# Patient Record
Sex: Male | Born: 1935
Health system: Southern US, Community
[De-identification: ages and names within clinical notes are randomized; demographics above are authoritative.]

## PROBLEM LIST (undated history)

## (undated) DIAGNOSIS — I10 Essential (primary) hypertension: Secondary | ICD-10-CM

## (undated) DIAGNOSIS — B0229 Other postherpetic nervous system involvement: Secondary | ICD-10-CM

## (undated) DIAGNOSIS — E782 Mixed hyperlipidemia: Secondary | ICD-10-CM

## (undated) DIAGNOSIS — E7800 Pure hypercholesterolemia, unspecified: Secondary | ICD-10-CM

## (undated) DIAGNOSIS — K219 Gastro-esophageal reflux disease without esophagitis: Secondary | ICD-10-CM

## (undated) DIAGNOSIS — I2510 Atherosclerotic heart disease of native coronary artery without angina pectoris: Secondary | ICD-10-CM

## (undated) HISTORY — DX: Gastro-esophageal reflux disease without esophagitis: K21.9

## (undated) HISTORY — DX: Essential (primary) hypertension: I10

## (undated) HISTORY — DX: Other postherpetic nervous system involvement: B02.29

## (undated) HISTORY — DX: Mixed hyperlipidemia: E78.2

## (undated) HISTORY — DX: Pure hypercholesterolemia, unspecified: E78.00

## (undated) HISTORY — DX: Atherosclerotic heart disease of native coronary artery without angina pectoris: I25.10

---

## 2001-03-17 ENCOUNTER — Encounter: Payer: Self-pay | Admitting: Cardiology

## 2007-11-07 ENCOUNTER — Ambulatory Visit: Payer: Self-pay | Admitting: Cardiology

## 2007-11-07 ENCOUNTER — Encounter: Payer: Self-pay | Admitting: Cardiology

## 2007-11-10 ENCOUNTER — Ambulatory Visit: Payer: Self-pay | Admitting: Cardiology

## 2007-11-15 ENCOUNTER — Ambulatory Visit: Payer: Self-pay | Admitting: Cardiology

## 2007-11-15 ENCOUNTER — Inpatient Hospital Stay (HOSPITAL_BASED_OUTPATIENT_CLINIC_OR_DEPARTMENT_OTHER): Admission: RE | Admit: 2007-11-15 | Discharge: 2007-11-15 | Payer: Self-pay | Admitting: Cardiology

## 2007-11-21 ENCOUNTER — Ambulatory Visit: Payer: Self-pay | Admitting: Cardiology

## 2007-11-21 ENCOUNTER — Ambulatory Visit (HOSPITAL_COMMUNITY): Admission: RE | Admit: 2007-11-21 | Discharge: 2007-11-22 | Payer: Self-pay | Admitting: Cardiology

## 2007-11-22 ENCOUNTER — Encounter: Payer: Self-pay | Admitting: Cardiology

## 2007-11-22 DIAGNOSIS — I2510 Atherosclerotic heart disease of native coronary artery without angina pectoris: Secondary | ICD-10-CM

## 2007-11-22 HISTORY — DX: Atherosclerotic heart disease of native coronary artery without angina pectoris: I25.10

## 2007-12-06 ENCOUNTER — Ambulatory Visit: Payer: Self-pay | Admitting: Cardiology

## 2007-12-07 ENCOUNTER — Encounter: Payer: Self-pay | Admitting: Cardiology

## 2008-02-28 ENCOUNTER — Encounter: Payer: Self-pay | Admitting: Cardiology

## 2008-05-10 ENCOUNTER — Ambulatory Visit: Payer: Self-pay | Admitting: Cardiology

## 2009-05-03 ENCOUNTER — Ambulatory Visit: Payer: Self-pay | Admitting: Cardiology

## 2009-05-03 DIAGNOSIS — R0602 Shortness of breath: Secondary | ICD-10-CM

## 2009-05-03 DIAGNOSIS — R079 Chest pain, unspecified: Secondary | ICD-10-CM

## 2009-05-03 DIAGNOSIS — I10 Essential (primary) hypertension: Secondary | ICD-10-CM | POA: Insufficient documentation

## 2009-05-03 DIAGNOSIS — I209 Angina pectoris, unspecified: Secondary | ICD-10-CM

## 2009-05-03 DIAGNOSIS — I2510 Atherosclerotic heart disease of native coronary artery without angina pectoris: Secondary | ICD-10-CM | POA: Insufficient documentation

## 2009-05-03 DIAGNOSIS — E7800 Pure hypercholesterolemia, unspecified: Secondary | ICD-10-CM | POA: Insufficient documentation

## 2009-05-06 ENCOUNTER — Encounter: Payer: Self-pay | Admitting: Cardiology

## 2009-05-08 ENCOUNTER — Encounter (INDEPENDENT_AMBULATORY_CARE_PROVIDER_SITE_OTHER): Payer: Self-pay | Admitting: *Deleted

## 2009-09-16 ENCOUNTER — Encounter: Payer: Self-pay | Admitting: Cardiology

## 2009-09-23 ENCOUNTER — Encounter: Payer: Self-pay | Admitting: Cardiology

## 2009-10-23 ENCOUNTER — Encounter: Payer: Self-pay | Admitting: Cardiology

## 2010-03-18 NOTE — Letter (Signed)
Summary: Engineer, materials at [facility]  [address]   [place], [place] [address]   Phone: [phone]  Fax: [phone]        May 08, 2009 MRN: [id number]   [facility] [address] [place], [place]  [address]   Dear Mr. EOFF,  Your test ordered by Selena Batten has been reviewed by your physician (or physician assistant) and was found to be normal or stable. Your physician (or physician assistant) felt no changes were needed at this time.  ____ Echocardiogram  ____ Cardiac Stress Test  __X__ Lab Work  ____ Peripheral vascular study of arms, legs or neck  ____ CT scan or X-ray  ____ Lung or Breathing test  ____ Other:   Thank you.   Hoover Brunette, LPN    Duane Boston, M.D., F.A.C.C. Thressa Sheller, M.D., F.A.C.C. Oneal Grout, M.D., F.A.C.C. Cheree Ditto, M.D., F.A.C.C. Daiva Nakayama, M.D., F.A.C.C. Kenney Houseman, M.D., F.A.C.C. Jeanne Ivan, PA-C

## 2010-03-18 NOTE — Miscellaneous (Signed)
Summary: Orders Update - FLP,LFT  Clinical Lists Changes  Orders: Added new Test order of T-Lipid Profile ([id number]) - Signed Added new Test order of T-Hepatic Function ([id number]) - Signed 

## 2010-03-18 NOTE — Medication Information (Signed)
Summary: RX Folder/ FAXED [facility] CHANGE  RX Folder/ FAXED [facility] CHANGE   Imported By: Dorise Hiss 09/17/2009 10:25:44  _____________________________________________________________________  External Attachment:    Type:   Image     Comment:   External Document

## 2010-03-18 NOTE — Assessment & Plan Note (Signed)
Summary: 1 YR FU PER MARCH REMINDER-SRS   Visit Type:  Follow-up Primary Provider:  Linna Darner  CC:  follow-up visit.  History of Present Illness: the patient is a 77 year old male with a history of a chronically occluded LAD status post revascularization with a Promus drug-eluting stent.the patient has reported no recurrence of substernal chest pain.the patient denies any shortness of breath orthopnea PND.he has a history of hypertension was well-controlled. He continues to complain of a chronic cough. He did have an upper airway cough syndrome. From cardiac standpoint is otherwise stable.  Clinical Review Panels:  CXR CXR results  Clinical Data: Pre-procedure evaluation for cardiac         catheterization.                   CHEST - 2 VIEW                             Comparison: 11/13/2002                   Findings: No focal consolidation, edema, or pleural effusion.         Cardiopericardial silhouette is borderline enlarged and likely         accentuated by the low lung volumes. Imaged bony structures of the         thorax are intact.                   IMPRESSION:         Stable exam.  No acute cardiopulmonary findings.                                           Reported By: ERIC Lora Paula, MD      (11/10/2007)  Cardiac Imaging Cardiac Cath Findings  CARDIAC CATHETERIZATION   RESULTS:  Initially, the left anterior descending artery was totally   occluded at its proximal portion.  Following stenting, the stenosis   improved from 100% to 0% and the flow improved from TIMI 0 to TIMI 3   flow.      CONCLUSION:  Successful PCI of a chronically totally occluded left   anterior descending artery using a PROMUS drug-eluting stent with   improvement of central narrowing from 100% to 0% and improvement of the   flow from TIMI 0 to TIMI 3 flow.      DISPOSITION:  The patient returned to post angio room for further   observation.  He is to remain on long-term Plavix and aspirin.                 [name], MD, [facility]  (11/21/2007)    Preventive Screening-Counseling & Management  Alcohol-Tobacco     Smoking Status: quit     Year Quit: 1980  Current Medications (verified): 1)  Amlodipine Besylate 10 Mg Tabs (Amlodipine Besylate) .... Take 1 Tablet By Mouth Once A Day 2)  Atenolol 50 Mg Tabs (Atenolol) .... Take 1 Tablet By Mouth Once A Day 3)  Chlorthalidone 25 Mg Tabs (Chlorthalidone) .... Take 1 Tablet By Mouth Once A Day 4)  Imdur 60 Mg Xr24h-Tab (Isosorbide Mononitrate) .... Take 1 Tablet By Mouth Once A Day 5)  Plavix 75 Mg Tabs (Clopidogrel Bisulfate) .... Take 1 Tablet By Mouth Once A Day 6)  Simvastatin 40  Mg Tabs (Simvastatin) .... Take 1 Tablet By Mouth Once A Day 7)  Nitrostat 0.4 Mg Subl (Nitroglycerin) .Marland Kitchen.. 1 Tablet Under Tongue At Onset of Chest Pain; You May Repeat Every 5 Minutes For Up To 3 Doses. 8)  Terazosin Hcl 5 Mg Caps (Terazosin Hcl) .... Take 1 Tablet By Mouth Once A Day 9)  Aspir-Trin 325 Mg Tbec (Aspirin) .... Take 1 Tablet By Mouth Once A Day 10)  Vitamin D 1000 Unit Tabs (Cholecalciferol) .... Take 2 Tablet By Mouth Once A Day 11)  Glucosamine-Chondroitin 1500-1200 Mg/35ml Liqd (Glucosamine-Chondroitin) .... Take 1 Tablet By Mouth Once A Day 12)  Aleve 220 Mg Tabs (Naproxen Sodium) .... Take 1 Tablet By Mouth Twice A Day  Allergies (verified): No Known Drug Allergies  Comments:  Nurse/Medical Assistant: The patient's medications and allergies were reviewed with the patient and were updated in the Medication and Allergy Lists. List reviewed.  Past History:  Family History: Last updated: 05/03/2009 Brother had Coronary artery bypass grafing Sister had MI  Risk Factors: Smoking Status: quit (05/03/2009)  Past Medical History: s/p LAD stent 10/09 Normal LV systolic function. Hypertension History of upper airway cough syndrome  Social History: Smoking Status:  quit  Review of Systems  The patient denies fatigue,  malaise, fever, weight gain/loss, vision loss, decreased hearing, hoarseness, chest pain, palpitations, shortness of breath, prolonged cough, wheezing, sleep apnea, coughing up blood, abdominal pain, blood in stool, nausea, vomiting, diarrhea, heartburn, incontinence, blood in urine, muscle weakness, joint pain, leg swelling, rash, skin lesions, headache, fainting, dizziness, depression, anxiety, enlarged lymph nodes, easy bruising or bleeding, and environmental allergies.    Vital Signs:  Patient profile:   77 year old male Height:      70 inches Weight:      202 pounds BMI:     29.09 O2 Sat:      96 % Pulse rate:   76 / minute BP sitting:   148 / 82  (left arm) Cuff size:   large  Vitals Entered By: Carlye Grippe (May 03, 2009 10:08 AM)  Nutrition Counseling: Patient's BMI is greater than 25 and therefore counseled on weight management options. CC: follow-up visit   Physical Exam  Additional Exam:  General: Well-developed, well-nourished in no distress head: Normocephalic and atraumatic eyes PERRLA/EOMI intact, conjunctiva and lids normal nose: No deformity or lesions mouth normal dentition, normal posterior pharynx neck: Supple, no JVD.  No masses, thyromegaly or abnormal cervical nodes lungs: Normal breath sounds bilaterally without wheezing.  Normal percussion heart: regular rate and rhythm with normal S1 and S2, no S3 or S4.  PMI is normal.  No pathological murmurs abdomen: Normal bowel sounds, abdomen is soft and nontender without masses, organomegaly or hernias noted.  No hepatosplenomegaly musculoskeletal: Back normal, normal gait muscle strength and tone normal pulsus: Pulse is normal in all 4 extremities Extremities: No peripheral pitting edema neurologic: Alert and oriented x 3 skin: Intact without lesions or rashes cervical nodes: No significant adenopathy psychologic: Normal affect    EKG  Procedure date:  05/03/2009  Findings:      normal sinus rhythm.  Normal EKG heart rate 69 beats per minute  Impression & Recommendations:  Problem # 1:  CORONARY ATHEROSCLEROSIS NATIVE CORONARY ARTERY (ICD-414.01) the patient is status post stent placement. He denies any recurrent chest pain. He stable. His updated medication list for this problem includes:    Amlodipine Besylate 10 Mg Tabs (Amlodipine besylate) .Marland Kitchen... Take 1 tablet by mouth once a  day    Atenolol 50 Mg Tabs (Atenolol) .Marland Kitchen... Take 1 tablet by mouth once a day    Imdur 60 Mg Xr24h-tab (Isosorbide mononitrate) .Marland Kitchen... Take 1 tablet by mouth once a day    Plavix 75 Mg Tabs (Clopidogrel bisulfate) .Marland Kitchen... Take 1 tablet by mouth once a day    Nitrostat 0.4 Mg Subl (Nitroglycerin) .Marland Kitchen... 1 tablet under tongue at onset of chest pain; you may repeat every 5 minutes for up to 3 doses.    Aspir-trin 325 Mg Tbec (Aspirin) .Marland Kitchen... Take 1 tablet by mouth once a day  Orders: EKG w/ Interpretation (93000) T-Lipid Profile ([id number]) T-Hepatic Function ([id number])  Problem # 2:  HYPERTENSION (ICD-401.9) blood pressure is well controlled. Continue current medical regimen. His updated medication list for this problem includes:    Amlodipine Besylate 10 Mg Tabs (Amlodipine besylate) .Marland Kitchen... Take 1 tablet by mouth once a day    Atenolol 50 Mg Tabs (Atenolol) .Marland Kitchen... Take 1 tablet by mouth once a day    Chlorthalidone 25 Mg Tabs (Chlorthalidone) .Marland Kitchen... Take 1 tablet by mouth once a day    Terazosin Hcl 5 Mg Caps (Terazosin hcl) .Marland Kitchen... Take 1 tablet by mouth once a day    Aspir-trin 325 Mg Tbec (Aspirin) .Marland Kitchen... Take 1 tablet by mouth once a day  Problem # 3:  PURE HYPERCHOLESTEROLEMIA (ICD-272.0) we will check a lipid panel and liver function tests. His updated medication list for this problem includes:    Simvastatin 40 Mg Tabs (Simvastatin) .Marland Kitchen... Take 1 tablet by mouth once a day  Orders: T-Lipid Profile ([id number]) T-Hepatic Function [phone])  Patient Instructions: 1)  Labs:  FLP / LFT 2)  Follow  up in  1 year

## 2010-05-19 ENCOUNTER — Encounter: Payer: Self-pay | Admitting: Cardiology

## 2010-05-19 ENCOUNTER — Ambulatory Visit (INDEPENDENT_AMBULATORY_CARE_PROVIDER_SITE_OTHER): Payer: Federal, State, Local not specified - PPO | Admitting: Cardiology

## 2010-05-19 DIAGNOSIS — E7800 Pure hypercholesterolemia, unspecified: Secondary | ICD-10-CM

## 2010-05-19 DIAGNOSIS — I2510 Atherosclerotic heart disease of native coronary artery without angina pectoris: Secondary | ICD-10-CM

## 2010-05-19 DIAGNOSIS — I10 Essential (primary) hypertension: Secondary | ICD-10-CM

## 2010-05-19 DIAGNOSIS — Z9861 Coronary angioplasty status: Secondary | ICD-10-CM

## 2010-05-19 MED ORDER — PRAVASTATIN SODIUM 40 MG PO TABS: 40.0000 mg | ORAL_TABLET | Freq: Every evening | ORAL | Status: DC

## 2010-05-19 NOTE — Progress Notes (Signed)
HPI The patient is a 77 year old male with history of chronically occluded LAD status post revascularization with drug-eluting stents. Procedure was performed in October of 2009. He has normal LV function. The patient has reportedly no recurrence of substernal chest pain. Her last office visit he was doing quite well. He did appear that he had an upper airway cough syndrome. The patient denies any chest pain. He has been doing well. He reports no shortness of breath orthopnea or PND. He has no claudications or syncope. The patient is requesting if he can discontinue Plavix. He has been out of the ears on Plavix after drug-eluting stent placement. I also pointed out the interaction of Norvasc and Zocor. His blood pressure is poorly controlled but he seemed a bit of pain after he visited the dentist this morning. At this point the patient states that he will recheck his blood pressure first. He will follow up with his primary care physician  No Known Allergies  No current outpatient prescriptions on file prior to visit.    Past Medical History  Diagnosis Date  . Pure hypercholesterolemia   . Essential hypertension, benign   . Coronary artery disease 11/22/2007    cardiac catheterization, percutaneous coronary intervention of the left   . GERD (gastroesophageal reflux disease)     No past surgical history on file.  Family History  Problem Relation Age of Onset  . Heart attack Sister   . Coronary artery disease Brother     CABG    History   Social History  . Marital Status: Married    Spouse Name: N/A    Number of Children: N/A  . Years of Education: N/A   Occupational History  . retired     work in Press photographer for 30 years   Social History Main Topics  . Smoking status: Former Smoker    Quit date: 02/16/1978  . Smokeless tobacco: Not on file  . Alcohol Use: Not on file  . Drug Use: Not on file  . Sexually Active: Not on file   Other Topics Concern  . Not on file   Social  History Narrative  . No narrative on file    review of systems:Pertinent positives as outlined above. The remainder of the 18  point review of systems is negative   PHYSICAL EXAM BP 179/71  Pulse 81  Ht 5\' 10"  (1.778 m)  Wt 202 lb (91.627 kg)  BMI 28.98 kg/m2  General: Well-developed, well-nourished in no distress Head: Normocephalic and atraumatic Eyes:PERRLA/EOMI intact, conjunctiva and lids normal Ears: No deformity or lesions Mouth:normal dentition, normal posterior pharynx Neck: Supple, no JVD.  No masses, thyromegaly or abnormal cervical nodes Lungs: Normal breath sounds bilaterally without wheezing.  Normal percussion Cardiac: regular rate and rhythm with normal S1 and S2, no S3 or S4.  PMI is normal.  No pathological murmurs Abdomen: Normal bowel sounds, abdomen is soft and nontender without masses, organomegaly or hernias noted.  No hepatosplenomegaly MSK: Back normal, normal gait muscle strength and tone normal Vascular: Pulse is normal in all 4 extremities Extremities: No peripheral pitting edema Neurologic: Alert and oriented x 3 Skin: Intact without lesions or rashes Lymphatics: No significant adenopathy Psychologic: Normal affect  WJX:BJYNWG sinus rhythm with no acute changes. Otherwise normal tracing heart rate 68 beats per minute   ASSESSMENT AND PLAN

## 2010-05-19 NOTE — Patient Instructions (Signed)
1.  Stop Zocor  2.  Change to Pravachol 40mg  every evening  3.  Stop Plavix 4.  Your physician wants you to follow up in:  1 year.  You will receive a reminder letter in the mail one-two months in advance.  If you don't receive a letter, please call our office to schedule the follow up appointment

## 2010-05-19 NOTE — Assessment & Plan Note (Signed)
The patient is now more than 2 years out from stent placement and can stop Plavix. We will continue aspirin.

## 2010-05-19 NOTE — Assessment & Plan Note (Signed)
Patient's patient's blood pressure is poorly controlled but he just returned from the dentist and has quite still a bit of pain. Have asked him to monitor his blood pressure and we will see the needs additional therapy.

## 2010-05-19 NOTE — Assessment & Plan Note (Signed)
There is a drug interaction between simvastatin and Norvasc at a high dose. We will switch the patient to Pravachol. This will be at equivalent dosing.

## 2010-07-01 NOTE — Assessment & Plan Note (Signed)
[facility]                          [facility] OFFICE NOTE   Frank, Hueston AKEEM GETTER Martinez             MRN:          [id number]  DATE:11/10/2007                            DOB:          [birth year]    REFERRING PHYSICIAN:  Erasmo Downer, MD   REFERRING PHYSICIAN:  Erasmo Downer, MD.   REASON FOR CONSULTATION:  Evaluation of the patient with abnormal  Cardiolite stress study.   HISTORY OF PRESENT ILLNESS:  The patient is a very pleasant 77 year old  male with no prior history of coronary artery disease.  The patient  recently saw Dr. Linna Darner in the office and reported substernal chest pain  during exertion.  He reported a chest pain across the entire precordium,  always set on by exertion.  This is then followed by difficulty  breathing and as well as a dry cough.  He also noticed some weight gain  over the last several months and attributes his dyspnea to his weight  gain.  He reports to me that tightness across the chest has been going  on for several months.  Also, when he lifts something heavy, he has  difficulty with breathing and he has to cough frequently.  The patient  was set up for Cardiolite study, which was interpreted by Dr. Olga Millers.  Dr. Jens Som felt that there was probably abnormal LV  perfusion, that there was small prior apically infarct, and mild-to-  moderate ischemia in the mid-to-distal anterior wall and apex.  The  ejection fraction was 56%.   ALLERGIES:  No known drug allergies.   MEDICATIONS:  1. Aspirin 81 mg p.o. daily.  2. Terazosin 5 mg p.o. daily.  3. Amlodipine 10 mg p.o. daily.  4. Chlorthalidone 25 mg p.o. daily.  5. Atenolol 50 mg p.o. daily.   SOCIAL HISTORY:  The patient used to work in Press photographer for 30 years, then  used to work for [employer] for [employer].  He is now  retired.  He does used to smoke tobacco, but quit 23 years ago.   FAMILY HISTORY:  Notable for brother who had  coronary artery bypass  grafting at age 63 and a sister who had a myocardial infarction.   PAST MEDICAL HISTORY:  1. Hypertension.  2. Possible dyslipidemia.   REVIEW OF SYSTEMS:  As per HPI.  The patient denies any nausea or  vomiting.  No fever or chills.  No melena or hematochezia.  No dysuria  or frequency.  No palpitations or syncope.  No visual changes.  Remainder of review of systems, otherwise, negative.   PHYSICAL EXAMINATION:  VITAL SIGNS:  Blood pressure 157/85, heart rate  is 67, and weight is 205 pounds.  NECK:  Normal carotid upstroke.  No carotid bruits.  LUNGS:  Clear breath sounds bilaterally.  HEART:  Regular rate and rhythm.  Normal S1 and S2.  No murmurs, rubs,  or gallops.  ABDOMEN:  Soft and nontender.  No rebound or guarding.  Good bowel  sounds.  EXTREMITIES:  No cyanosis, clubbing, or edema.  NEURO:  The patient is alert, oriented, and grossly non focal.   PROBLEM LIST:  1. Angina.  2. Abnormal Cardiolite stress study.  3. Normal ejection fraction.  4. Cardiac risk factors.   PLAN:  1. The patient's symptoms are consistent with angina.  He has a      positive Cardiolite study and likely significant coronary artery      disease.  I have discussed the risk and benefit of cardiac      catheterization with the patient.  He is willing to proceed.  In      the interim, I have given the patient prescription of Imdur and      p.r.n. nitroglycerin.  I also advised him that if he develops      resting angina, he needs to present himself immediately to the      emergency room and take nitroglycerin.  The patient is tentatively      scheduled for next Tuesday for cardiac catheterization at the [facility].     Learta Codding, MD,FACC  Electronically Signed    GED/MedQ  DD: 11/11/2007  DT: 11/12/2007  Job #: [id number]   cc:   Erasmo Downer, MD

## 2010-07-01 NOTE — Assessment & Plan Note (Signed)
[facility]                          [facility]ICE NOTE   Frank, Libertini JACO TREAS Martinez             MRN:          [id number]  DATE:05/10/2008                            DOB:          1933/05/16    REFERRING PHYSICIAN:  Erasmo Downer, MD   HISTORY OF PRESENT ILLNESS:  The patient is a 77 year old male with  history of chronic occluded LAD which was opened by Dr. Juanda Chance and a  Promus drug-eluting stent was placed.  The patient has had dramatic  improvement.  Since that time, he has no chest pain or shortness of  breath.  He uses his Wii at home as an exercise program.  The patient's  lipid panel also demonstrates that he had been exercising quite a bit as  his HDL has come up from 30-38 over a little bit more than 3 months.  His LDL cholesterol is only 68.  He is doing remarkably well.  He has no  dyspnea, shortness of breath, orthopnea, PND, palpitations, or syncope.  His blood pressure is elevated today, but this has not been in the case  in the past and Dr. Linna Darner is following this.   MEDICATIONS:  1. Terazosin 5 mg p.o. daily.  2. Amlodipine 10 mg p.o. daily.  3. Chlorthalidone 25 mg p.o. daily.  4. Atenolol 50 mg p.o. daily.  5. Plavix 75 mg p.o. daily.  6. Aspirin 325 mg p.o. daily.  7. Simvastatin 40 mg p.o. daily.  8. Vitamin D.  9. Glucosamine.   PHYSICAL EXAMINATION:  VITAL SIGNS:  Blood pressure 151/82, heart rate  64, and weight 206 pounds.  NECK:  Normal carotid upstroke.  No carotid bruits.  No thyromegaly.  No  nodular thyroid.  JVP is 6 cm.  LUNGS:  Clear breath sounds bilaterally.  HEART:  Regular rate and rhythm.  Normal S1 and S2.  No murmur, rubs, or  gallops.  ABDOMEN:  Soft and nontender.  No rebound or guarding.  Good bowel  sounds.  EXTREMITIES:  No cyanosis, clubbing, or edema.   PROBLEM LIST:  1. Status post Promus drug-eluting stent to chronic occluded LAD      (asymptomatic).  2. Improved normal  ejection fraction.  3. Hypertension (followed by Dr. Linna Darner).  4. Dizziness, resolved.  5. Cough (likely secondary to gastroesophageal reflux disease).   PLAN:  1. The patient's blood pressure is not subsequently well controlled,      but I do think there is a component of white-coat hypertension.      Last office visit, the blood pressure was better.  I will ask Dr.      Linna Darner to further followup on this and get more blood pressure      readings and adjust his medications as needed accordingly.  2. We reviewed the patient's lipid panel and this is at goal.  This      can be repeated in the next couple of months.  3. The patient does have a cough which likely related to      gastroesophageal reflux disease and I have started the patient on      Zantac 150  mg p.o. nightly.  We will see the patient back in 1      year.      Learta Codding, MD,FACC  Electronically Signed    GED/MedQ  DD: 05/10/2008  DT: 05/10/2008  Job #: [id number]   cc:   Erasmo Downer, MD

## 2010-07-01 NOTE — Cardiovascular Report (Signed)
NAMEKENJIRO, [name]                ACCOUNT NO.:  [id number]   MEDICAL RECORD NO.:  [id number]          PATIENT TYPE:  INP   LOCATION:  6526                         FACILITY:  [facility]   PHYSICIAN:  Everardo Beals. Juanda Chance, MD, FACCDATE OF BIRTH:  04-May-1933   DATE OF PROCEDURE:  11/21/2007  DATE OF DISCHARGE:                            CARDIAC CATHETERIZATION   CLINICAL HISTORY:  [name] is 77 years old and was developed symptoms  of shortness of breath about a year ago.  This was initially thought to  be pulmonary, but he subsequently had a stress test, which was abnormal  and saw Dr. Andee Lineman and was referred for angiography, which was performed  by Dr. Antoine Poche last week.  This showed a total occlusion of the LAD,  although the area of the occlusion was quite short and there were some  bridging collaterals.  The lesion appeared reasonably favorable for  intervention and we brought him back today for an attempt at treatment  of the chronic total occlusion.   PROCEDURE:  The procedure was performed by the right femoral artery and  arterial sheath and a 6-French Q4 guiding catheter with side holes.  The  patient had been started on Plavix last week and was given an additional  300 mg load and took a noncoated aspirin this morning.  The patient was  given an Angiomax bolus and infusion.  We first went in with a PT2 Light  Support wire.  We were unable to cross the lesion with this wire.  We  then used a [employer] 4.5 wire with a sharp bend where the vessel  made a sharp turn.  We supported this with a 2.0 x 15 mm over the wire  Apex balloon.  With the help of the balloon, we were able to cross the  lesion with the [employer] wire into the distal vessel.  We then  advanced the balloon across the lesion, did a distal injection to be  sure we were in the true channel.  We then passed a Prowater wire  through the balloon.  We performed 2 dilatations up to 10 atmospheres  for 30 seconds  with the 2.0 balloon at the lesion site.  We then  exchanged for a 2.5 x 20 mm Apex balloon and performed 1 inflation up to  8 atmospheres for 30 seconds.  We then deployed a 2.75 x 25 mm PROMUS  stent.  We repositioned the stent so the distal edge was just before  bifurcation of the diagonal branch and the proximal stent was located  just proximal to a septal perforator.  We deployed this with 1 inflation  at 12 atmospheres for 30 seconds.  We postdilated with a 3.0 x 15 mm Hastings  Voyager performing 3 inflations at 18 atmospheres for 30 seconds.  Final  diagnostics were then performed through the guiding catheter.  The  patient tolerated the procedure well and left laboratory in satisfactory  condition.   RESULTS:  Initially, the left anterior descending artery was totally  occluded at its proximal portion.  Following stenting, the stenosis  improved from 100% to 0% and the flow improved from TIMI 0 to TIMI 3  flow.   CONCLUSION:  Successful PCI of a chronically totally occluded left  anterior descending artery using a PROMUS drug-eluting stent with  improvement of central narrowing from 100% to 0% and improvement of the  flow from TIMI 0 to TIMI 3 flow.   DISPOSITION:  The patient returned to post angio room for further  observation.  He is to remain on long-term Plavix and aspirin.      [name], MD, [facility]  Electronically Signed     BRB/MEDQ  D:  11/21/2007  T:  11/22/2007  Job:  [id number]   cc:   Learta Codding, MD,FACC  Rollene Rotunda, MD, [facility]  Erasmo Downer, MD

## 2010-07-01 NOTE — Discharge Summary (Signed)
[name], [name]                ACCOUNT NO.:  [id number]   MEDICAL RECORD NO.:  [id number]          PATIENT TYPE:  INP   LOCATION:  6526                         FACILITY:  [facility]   PHYSICIAN:  Everardo Beals. Juanda Chance, MD, FACCDATE OF BIRTH:  [birth year]   DATE OF ADMISSION:  11/21/2007  DATE OF DISCHARGE:  11/22/2007                               DISCHARGE SUMMARY   PRIMARY CARDIOLOGIST:  Learta Codding, MD, [facility].   PRIMARY CARE PHYSICIAN:  Erasmo Downer, MD in [place].   PROCEDURES PERFORMED DURING HOSPITALIZATION:  1. Cardiac catheterization first performed by Dr. Charlies Constable.  2. Percutaneous coronary intervention to the left anterior descending      reducing it from 100% to 0% using a Promus 2.75 mm x 23-mm drug-      eluting stent.   FINAL DISCHARGE DIAGNOSES:  1. Coronary artery disease.      a.     Status post cardiac catheterization.      b.     Status post percutaneous coronary intervention of the left       anterior descending reducing it 100% to 0%.  2. Hypertension.  3. Hyperlipidemia.   HOSPITAL COURSE:  This is a 77 year old male with no prior history of  CAD who had an abnormal Cardiolite stress test per Dr. Erasmo Downer in  [place].  The patient had complaints of chest tightness that had been going  on for several months previous to the stress test.  The patient was sent  to [facility] for cardiac catheterization in the setting.  Cardiolite stress test revealed small prior apical infarct and mild to  moderate ischemia in the mid to distal anterior wall and apex.  The  patient did undergo cardiac catheterization per Dr. Juanda Chance on November 21, 2003.  Please see Dr. Regino Schultze thorough cardiac catheterization note for  more details.  The patient recovered well without evidence of bleeding,  hematoma, or signs of infection.  The patient was placed on Plavix and  simvastatin prior to discharge.  The patient was seen and examined by  Dr. Charlies Constable on day of discharge  and found to be stable with vital  signs stable and BUN and creatinine stable.  The patient will follow up  with Dr. Andee Lineman in 2 weeks for continued cardiac management.   LABS ON DISCHARGE:  Sodium 140, potassium 3.4, chloride 101, CO2 of 30,  BUN 15, creatinine 1.09, glucose 84.  Hemoglobin 50.8, hematocrit 46.8,  white blood cells 7.3, platelets 103.  PTT 33, PT 14.1, INR 1.1.   DISCHARGE VITAL SIGNS:  Blood pressure 162/56, pulse 67, respirations  15, temperature 98.3, and O2 sat 93% on room air.   DISCHARGE MEDICATIONS:  1. Atenolol 50 mg one p.o. daily.  2. Plavix 75 mg one p.o. daily.  3. Terazosin 5 mg daily.  4. Amlodipine 10 mg daily.  5. Isosorbide mononitrate ER 60 mg daily.  6. Chlorthalidone 25 mg daily.  7. Aspirin 325 mg daily.  8. Aleve p.r.n.  9. Glucosamine chondroitin p.r.n.  10.Vitamin D p.r.n.  11.Simvastatin 40 mg one  p.o. daily.  12.Nitroglycerin 0.4 mg as needed for chest pain.   ALLERGIES:  No known drug allergies.   FOLLOWUP PLANS AND APPOINTMENT:  1. The patient will be seen by Dr. Andee Lineman on December 06, 2007, at 1      p.m. for followup cardiac catheterization and PCI evaluation.  2. The patient will follow up with primary care physician for      continued medical management.  3. The patient was given post cardiac catheterization instructions      with particular emphasis on right groin site for evidence of      bleeding, hematoma, and infection.    Time spent with the patient to include physician time 30 minutes.      Bettey Mare. Lyman Bishop, NP      Everardo Beals. Juanda Chance, MD, [facility]  Electronically Signed    KML/MEDQ  D:  11/22/2007  T:  11/22/2007  Job:  [id number]   cc:   Learta Codding, MD,FACC

## 2010-07-01 NOTE — Cardiovascular Report (Signed)
[name], [name]                ACCOUNT NO.:  [id number]   MEDICAL RECORD NO.:  [id number]          PATIENT TYPE:  OIB   LOCATION:  1961                         FACILITY:  [facility]   PHYSICIAN:  Rollene Rotunda, MD, FACCDATE OF BIRTH:  May 10, 1933   DATE OF PROCEDURE:  11/15/2007  DATE OF DISCHARGE:                            CARDIAC CATHETERIZATION   PRIMARY CARE PHYSICIAN:  Erasmo Downer, MD   CARDIOLOGIST:  Learta Codding, MD,FACC   PROCEDURE:  Left heart catheterization/coronary arteriography.   INDICATIONS:  The patient with chest pain, dyspnea on exertion (all  suggestive unstable angina), and an abnormal Cardiolite suggesting  anteroapical ischemia.   PROCEDURE NOTE:  Left heart catheterization was performed via the right  femoral artery.  The artery was cannulated using the anterior wall  puncture.  A #6-French arterial sheath was inserted via the modified  Seldinger technique.  Preformed Judkins and a pigtail catheter were  utilized.  The patient tolerated the procedure well and left the lab in  stable condition.   RESULTS:  Hemodynamics LV 155/21, AO 152/97.  Coronaries, left main was  calcified.  There was ostial and distal 25% stenosis.  The LAD had  ostial calcification with 30% stenosis.  There is proximal long 99%  stenosis that terminated and total occlusion, but there was some  reasonable filling of the LAD with collaterals demonstrating two  moderate-sized diagonals and a large LAD.  Circumflex in the AV groove  had proximal calcifications.  There were proximal luminal  irregularities.  First obtuse marginal was small, ostial 30% stenosis  and second obtuse marginal was small with ostial 50% stenosis.  Third  obtuse marginal was large and normal.  Posterolateral was moderate sized  and normal.  The right coronary artery is very large dominant vessel.  There are diffuse luminal irregularities.  The PDA was large with ostial  25% stenosis.  There is mid 50%  stenosis.  The posterolateral-1 had 25%  followed by 25% stenosis.  Posterolateral-2 was large with luminal  irregularities.  The LVEF was 65% with apical hypokinesis.   CONCLUSION:  Severe single-vessel coronary artery disease with mildly  reduced left ventricular wall motion.   PLAN:  The patient has continued to have symptoms.  There is good  viability in the anterior wall as it has some motion and collateral  flow.  Therefore, we will review the films with Dr. Juanda Chance and the plan  will be for elective revascularization of the LAD.      Rollene Rotunda, MD, [facility]  Electronically Signed    JH/MEDQ  D:  11/15/2007  T:  11/15/2007  Job:  [id number]   cc:   Erasmo Downer, MD

## 2010-07-01 NOTE — Assessment & Plan Note (Signed)
[facility]                          [facility] OFFICE NOTE   NAME:[name], [name]             MRN:          [id number]  DATE:12/06/2007                            DOB:          Nov 28, 1933    REFERRING PHYSICIAN:  Erasmo Downer, MD   HISTORY OF PRESENT ILLNESS:  The patient is a 77 year old male with no  prior history of coronary artery disease, underwent recent stress test  which was abnormal and he underwent catheterization by Dr. Juanda Chance opened  a chronically occluded LAD.  The patient had dramatic symptom  improvement after this procedure, however.  The patient received a  Promus drug-eluting stent.  He is now on Plavix.  He is doing quite  well.  He denies any chest pain, shortness of breath, orthopnea, or PND.  He has no palpitations or syncope.   MEDICATIONS:  1. Terazosin 5 mg p.o. daily.  2. Amlodipine 10 mg p.o. daily.  3. Chlorthalidone 25 mg p.o. daily.  4. Atenolol 50 mg daily.  5. Plavix 75 daily.  6. Isosorbide mononitrate 60 mg daily.  7. Aspirin 325 daily.  8. Simvastatin 40 mg p.o. daily.  9. Vitamin E.  10.Glucosamine.   PHYSICAL EXAMINATION:  VITAL SIGNS:  Blood pressure 142/70, heart rate  63, weight 224.  NECK:  Normal carotid upstroke.  No carotid bruits.  LUNGS:  Clear breath sounds bilaterally.  HEART:  Regular rate and rhythm.  Normal S1 and S2.  No murmurs, rubs,  or gallops.  ABDOMEN:  Soft, nontender.  No rebound or guarding.  Good bowel sounds.  The right groin is stable.  There is no bruit.  EXTREMITIES:  No cyanosis, clubbing, or edema.  NEUROLOGIC:  The patient is alert and oriented.  Grossly nonfocal.   PROBLEMS:  1. Status post Promus drug-eluting stent to a chronically occluded LAD      favorable for percutaneous coronary intervention.  2. Resolution of symptoms.  3. Normal ejection fraction.  4. Hypertension.  5. Dizziness.   PLAN:  1. The patient complains of some slight dizziness  likely secondary to      Imdur due to preload reduction we cut that to  30 mg p.o. daily.  2. The patient will follow up in 3 months with [place].  We will do some      cholesterol testing at that      time as well as lipid panel.  3. Dr. Linna Darner has been notified about the patient's condition.     Learta Codding, MD,FACC  Electronically Signed    GED/MedQ  DD: 12/06/2007  DT: 12/07/2007  Job #: [id number]   cc:   Erasmo Downer, MD

## 2010-10-17 DIAGNOSIS — R079 Chest pain, unspecified: Secondary | ICD-10-CM

## 2010-10-23 DIAGNOSIS — R072 Precordial pain: Secondary | ICD-10-CM

## 2010-10-24 ENCOUNTER — Encounter: Payer: Self-pay | Admitting: Cardiovascular Disease

## 2010-11-04 ENCOUNTER — Other Ambulatory Visit: Payer: Self-pay | Admitting: *Deleted

## 2010-11-04 DIAGNOSIS — I2510 Atherosclerotic heart disease of native coronary artery without angina pectoris: Secondary | ICD-10-CM

## 2010-11-04 MED ORDER — PRAVASTATIN SODIUM 40 MG PO TABS: 40.0000 mg | ORAL_TABLET | Freq: Every evening | ORAL | Status: DC

## 2010-11-05 ENCOUNTER — Encounter: Payer: Self-pay | Admitting: Cardiology

## 2010-11-05 ENCOUNTER — Ambulatory Visit (INDEPENDENT_AMBULATORY_CARE_PROVIDER_SITE_OTHER): Payer: Medicare Other | Admitting: Physician Assistant

## 2010-11-05 DIAGNOSIS — I2510 Atherosclerotic heart disease of native coronary artery without angina pectoris: Secondary | ICD-10-CM

## 2010-11-05 DIAGNOSIS — E7800 Pure hypercholesterolemia, unspecified: Secondary | ICD-10-CM

## 2010-11-05 DIAGNOSIS — I10 Essential (primary) hypertension: Secondary | ICD-10-CM

## 2010-11-05 MED ORDER — LISINOPRIL 10 MG PO TABS: 10.0000 mg | ORAL_TABLET | Freq: Every day | ORAL | Status: DC

## 2010-11-05 NOTE — Assessment & Plan Note (Signed)
Inadequately controlled. We'll start lisinopril 10 mg daily for aggressive management. Will check metabolic profile in one week. Patient to continue monitoring closely at home, and at time of scheduled visits with Dr. Olena Leatherwood.

## 2010-11-05 NOTE — Patient Instructions (Signed)
Your physician recommends that you go to the [facility] for lab work for FLP & LFT (cholesterol) - today and BMET in 7-10 days.   If the results of your test are normal or stable, you will receive a letter.  If they are abnormal, the nurse will contact you by phone. Decrease Aspirin to 81mg  daily Begin Lisinopril 10mg  daily Your physician wants you to follow up in: 6 months.  You will receive a reminder letter in the mail one-two months in advance.  If you don't receive a letter, please call our office to schedule the follow up appointment

## 2010-11-05 NOTE — Assessment & Plan Note (Signed)
Will reassess lipid status, on current dose of pravastatin. Previous LDL 57, 9/11.

## 2010-11-05 NOTE — Assessment & Plan Note (Signed)
No further workup indicated. Recent post hospital Lexus scan Cardiolite was negative for ischemia; EF 56%, following presentation with atypical CP. Will continue medical management, with low dose aspirin. Plavix recently discontinued, given that he is now nearly 3 years out from PCI/DES, 10/09.

## 2010-11-05 NOTE — Progress Notes (Signed)
HPI: Patient presents for post hospital followup.  Recently referred to [place] in consultation here at [facility], following presentation with atypical CP. In fact, the patient reported that he most likely had recurrent shingles. He ruled out for MI, and was referred for an outpatient stress test. This was performed as a pharmacologic imaging study (patient apparently unable to achieve 85% PMHR.). Diffusion imaging was negative for definite ischemia; EF 56%.  Clinically, patient continues to report no exertional angina pectoris. His symptoms have since resolved.  Patient does have poorly controlled HTN. He reports readings of 160-170 systolic at home.  No Known Allergies  Current Outpatient Prescriptions on File Prior to Visit  Medication Sig Dispense Refill  . amLODipine (NORVASC) 10 MG tablet Take 10 mg by mouth daily.        Marland Kitchen aspirin 325 MG tablet Take 325 mg by mouth daily.        Marland Kitchen atenolol (TENORMIN) 50 MG tablet Take 50 mg by mouth daily.        . chlorthalidone (HYGROTON) 25 MG tablet Take 25 mg by mouth daily.        . cholecalciferol (VITAMIN D) 1000 UNITS tablet Take 2,000 Units by mouth daily.        . Glucosamine-Chondroit-Vit C-Mn (GLUCOSAMINE CHONDR 1500 COMPLX) CAPS Take 1 capsule by mouth daily.       . isosorbide mononitrate (IMDUR) 60 MG 24 hr tablet Take 60 mg by mouth daily.        . nitroGLYCERIN (NITROSTAT) 0.4 MG SL tablet Place 0.4 mg under the tongue every 5 (five) minutes as needed.        . pravastatin (PRAVACHOL) 40 MG tablet Take 1 tablet (40 mg total) by mouth every evening.  90 tablet  3  . terazosin (HYTRIN) 5 MG capsule Take 5 mg by mouth at bedtime.          Past Medical History  Diagnosis Date  . Pure hypercholesterolemia   . Essential hypertension, benign   . Coronary artery disease 11/22/2007    cardiac catheterization, percutaneous coronary intervention of the left   . GERD (gastroesophageal reflux disease)     No past surgical history on  file.  History   Social History  . Marital Status: Married    Spouse Name: N/A    Number of Children: N/A  . Years of Education: N/A   Occupational History  . retired     work in Press photographer for 30 years   Social History Main Topics  . Smoking status: Former Smoker -- 0.5 packs/day for 25 years    Types: Cigarettes    Quit date: 02/16/1978  . Smokeless tobacco: Never Used  . Alcohol Use: Not on file  . Drug Use: Not on file  . Sexually Active: Not on file   Other Topics Concern  . Not on file   Social History Narrative  . No narrative on file    Family History  Problem Relation Age of Onset  . Heart attack Sister   . Coronary artery disease Brother     CABG    ROS: Negative for exertional chest pain, DOE, orthopnea, PND, lower extremity edema, palpitations, presyncope/syncope, claudication, reflux, hematuria, hematochezia, or melena. Remaining systems reviewed, and are negative.   PHYSICAL EXAM:  BP 185/81  Pulse 67  Ht 5\' 10"  (1.778 m)  Wt 190 lb (86.183 kg)  BMI 27.26 kg/m2  GENERAL: well-nourished, well-developed; NAD HEENT: NCAT, PERRLA, EOMI; sclera clear; no xanthelasma NECK:  palpable bilateral carotid pulses, no bruits; no JVD; no TM LUNGS: CTA bilaterally CARDIAC: RRR (S1, S2); no significant murmurs; no rubs or gallops ABDOMEN: soft, non-tender; intact BS EXTREMETIES: intact distal pulses; no significant peripheral edema SKIN: warm/dry; no obvious rash/lesions MUSCULOSKELETAL: no joint deformity NEURO: no focal deficit; NL affect   EKG:    ASSESSMENT & PLAN:

## 2010-11-13 ENCOUNTER — Telehealth: Payer: Self-pay | Admitting: *Deleted

## 2010-11-13 NOTE — Telephone Encounter (Signed)
Message copied by Eustace Moore on Thu Nov 13, 2010  3:51 PM ------      Message from: Lewayne Bunting E      Created: Thu Nov 13, 2010  1:33 PM       Lipid panel and liver function tests at goal.

## 2010-11-13 NOTE — Telephone Encounter (Signed)
Patient informed. 

## 2010-11-18 LAB — BASIC METABOLIC PANEL
CO2: 29
CO2: 30
Calcium: 9.2
Calcium: 9.8
Creatinine, Ser: 1.15
GFR calc Af Amer: >60
GFR calc Af Amer: >60
GFR calc non Af Amer: >60
GFR calc non Af Amer: >60
Glucose, Bld: 109 — ABNORMAL HIGH
Sodium: 139
Sodium: 140

## 2010-11-18 LAB — CBC
MCHC: 33.8
MCHC: 34
Platelets: 103 — ABNORMAL LOW
Platelets: 125 — ABNORMAL LOW
RBC: 5.11
RBC: 5.12
RDW: 13.5

## 2010-11-18 LAB — PROTIME-INR
INR: 1.1
Prothrombin Time: 14.2

## 2011-02-11 ENCOUNTER — Other Ambulatory Visit: Payer: Self-pay | Admitting: *Deleted

## 2011-02-11 MED ORDER — AMLODIPINE BESYLATE 10 MG PO TABS: 10.0000 mg | ORAL_TABLET | Freq: Every day | ORAL | Status: DC

## 2011-02-11 MED ORDER — ATENOLOL 50 MG PO TABS: 50.0000 mg | ORAL_TABLET | Freq: Every day | ORAL | Status: DC

## 2011-02-11 MED ORDER — CHLORTHALIDONE 25 MG PO TABS: 25.0000 mg | ORAL_TABLET | Freq: Every day | ORAL | Status: DC

## 2011-03-02 DIAGNOSIS — N400 Enlarged prostate without lower urinary tract symptoms: Secondary | ICD-10-CM | POA: Diagnosis not present

## 2011-03-02 DIAGNOSIS — N529 Male erectile dysfunction, unspecified: Secondary | ICD-10-CM | POA: Diagnosis not present

## 2011-03-02 DIAGNOSIS — I209 Angina pectoris, unspecified: Secondary | ICD-10-CM | POA: Diagnosis not present

## 2011-03-02 DIAGNOSIS — B0222 Postherpetic trigeminal neuralgia: Secondary | ICD-10-CM | POA: Diagnosis not present

## 2011-03-02 DIAGNOSIS — I10 Essential (primary) hypertension: Secondary | ICD-10-CM | POA: Diagnosis not present

## 2011-05-04 ENCOUNTER — Ambulatory Visit (INDEPENDENT_AMBULATORY_CARE_PROVIDER_SITE_OTHER): Payer: Federal, State, Local not specified - PPO | Admitting: Cardiology

## 2011-05-04 ENCOUNTER — Encounter: Payer: Self-pay | Admitting: Cardiology

## 2011-05-04 VITALS — BP 173/89 | HR 67 | Ht 70.0 in | Wt 200.0 lb

## 2011-05-04 DIAGNOSIS — I10 Essential (primary) hypertension: Secondary | ICD-10-CM | POA: Diagnosis not present

## 2011-05-04 DIAGNOSIS — B0229 Other postherpetic nervous system involvement: Secondary | ICD-10-CM | POA: Insufficient documentation

## 2011-05-04 DIAGNOSIS — I2510 Atherosclerotic heart disease of native coronary artery without angina pectoris: Secondary | ICD-10-CM

## 2011-05-04 MED ORDER — TERAZOSIN HCL 2 MG PO CAPS: 2.0000 mg | ORAL_CAPSULE | Freq: Every day | ORAL | Status: DC

## 2011-05-04 NOTE — Patient Instructions (Signed)
   Begin Hytrin 2mg  every morning (this is in addition to your evening dose) Your physician wants you to follow up in: 6 months.  You will receive a reminder letter in the mail one-two months in advance.  If you don't receive a letter, please call our office to schedule the follow up appointment

## 2011-05-04 NOTE — Progress Notes (Signed)
Frank Bottoms, MD, [facility] Board Certified in Adult Cardiovascular Medicine,Internal Medicine and Critical Care Medicine    CC: Followup patient with history of atypical chest pain with known coronary artery disease  HPI: Patient was hospitalized several months ago with atypical chest pain. In retrospect this was secondary to shingles and postherpetic neuralgia. He did undergo stress test which was within normal limits with no ischemia and ejection fraction of 56%. The patient state that overall is doing well. He denies any chest pain shortness of breath orthopnea or PND. He has no palpitations or syncope. Blood pressure is elevated in the office today .  PMH: reviewed and listed in Problem List in Electronic Records (and see below) Past Medical History  Diagnosis Date  . Pure hypercholesterolemia   . Essential hypertension, benign     Poorly controlled  . Coronary artery disease 11/22/2007    Status post PCI with drug-eluting stent 2009, September 2012 negative outpatient stress test  . GERD (gastroesophageal reflux disease)   . Postherpetic neuralgia    No past surgical history on file.  Allergies/SH/FHX : available in Electronic Records for review  No Known Allergies History   Social History  . Marital Status: Married    Spouse Name: N/A    Number of Children: N/A  . Years of Education: N/A   Occupational History  . retired     work in Press photographer for 30 years   Social History Main Topics  . Smoking status: Former Smoker -- 0.5 packs/day for 25 years    Types: Cigarettes    Quit date: 02/16/1978  . Smokeless tobacco: Never Used  . Alcohol Use: Not on file  . Drug Use: Not on file  . Sexually Active: Not on file   Other Topics Concern  . Not on file   Social History Narrative  . No narrative on file   Family History  Problem Relation Age of Onset  . Heart attack Sister   . Coronary artery disease Brother     CABG    Medications: Current Outpatient  Prescriptions  Medication Sig Dispense Refill  . amLODipine (NORVASC) 10 MG tablet Take 1 tablet (10 mg total) by mouth daily.  90 tablet  3  . aspirin 325 MG tablet Take 325 mg by mouth daily.        Marland Kitchen atenolol (TENORMIN) 50 MG tablet Take 1 tablet (50 mg total) by mouth daily.  90 tablet  3  . chlorthalidone (HYGROTON) 25 MG tablet Take 1 tablet (25 mg total) by mouth daily.  90 tablet  3  . cholecalciferol (VITAMIN D) 1000 UNITS tablet Take 2,000 Units by mouth daily.        . Glucosamine-Chondroit-Vit C-Mn (GLUCOSAMINE CHONDR 1500 COMPLX) CAPS Take 1 capsule by mouth daily.       . isosorbide mononitrate (IMDUR) 60 MG 24 hr tablet Take 60 mg by mouth daily.        . nitroGLYCERIN (NITROSTAT) 0.4 MG SL tablet Place 0.4 mg under the tongue every 5 (five) minutes as needed.        . pravastatin (PRAVACHOL) 40 MG tablet Take 1 tablet (40 mg total) by mouth every evening.  90 tablet  3  . terazosin (HYTRIN) 5 MG capsule Take 5 mg by mouth at bedtime.        Marland Kitchen terazosin (HYTRIN) 2 MG capsule Take 1 capsule (2 mg total) by mouth daily. (every morning)  30 capsule  6  ROS: No nausea or vomiting. No fever or chills.No melena or hematochezia.No bleeding.No claudication  Physical Exam: BP 173/89  Pulse 67  Ht 5\' 10"  (1.778 m)  Wt 200 lb (90.719 kg)  BMI 28.70 kg/m2 General: Well-nourished white male. In no distress Neck: Normal carotid upstroke no carotid bruits. No thyromegaly nonnodular thyroid. JVP is 6 cm Lungs: Clear breath sounds bilaterally without wheezing Cardiac: Regular rate and rhythm with normal S1-S2. No murmur rubs or gallops. Vascular: No edema. Normal distal pulses Skin: Warm and dry Physcologic: Normal affect  12lead ECG: Normal sinus rhythm no acute changes Limited bedside ECHO:N/A No images are attached to the encounter.    Patient Active Problem List  Diagnoses  . PURE HYPERCHOLESTEROLEMIA  . OTHER AND UNSPECIFIED ANGINA PECTORIS  . SHORTNESS OF BREATH  .  Essential hypertension, benign  . Coronary artery disease  . Postherpetic neuralgia    PLAN   Recent negative Cardiolite stress study. No recurrent angina. Continue medical therapy and therapeutic lifestyle changes.  Blood pressure is poorly controlled and patient is already taking Hytrin for BPH. I added a small dose of Hytrin at 2 mg in the morning for better blood pressure control. This is in addition to his amlodipine, atenolol and chlorthalidone.  The patient in followup in one year.

## 2011-06-02 DIAGNOSIS — I10 Essential (primary) hypertension: Secondary | ICD-10-CM | POA: Diagnosis not present

## 2011-09-08 DIAGNOSIS — I10 Essential (primary) hypertension: Secondary | ICD-10-CM | POA: Diagnosis not present

## 2011-09-08 DIAGNOSIS — E782 Mixed hyperlipidemia: Secondary | ICD-10-CM | POA: Diagnosis not present

## 2011-10-27 ENCOUNTER — Telehealth: Payer: Self-pay | Admitting: Cardiology

## 2011-10-27 NOTE — Telephone Encounter (Signed)
Called patient to reschedule appointment.  Will reschedule appointment with another provider.  Requested patient to call us to confirm appointment change.  Sending reminder letter to patient as well. ° °

## 2011-11-05 ENCOUNTER — Encounter: Payer: Self-pay | Admitting: Cardiovascular Disease

## 2011-11-05 ENCOUNTER — Ambulatory Visit (INDEPENDENT_AMBULATORY_CARE_PROVIDER_SITE_OTHER): Payer: Federal, State, Local not specified - PPO | Admitting: Cardiovascular Disease

## 2011-11-05 ENCOUNTER — Ambulatory Visit: Payer: Federal, State, Local not specified - PPO | Admitting: Cardiology

## 2011-11-05 VITALS — BP 138/78 | HR 62 | Ht 70.5 in | Wt 191.0 lb

## 2011-11-05 DIAGNOSIS — E7800 Pure hypercholesterolemia, unspecified: Secondary | ICD-10-CM | POA: Diagnosis not present

## 2011-11-05 DIAGNOSIS — I2510 Atherosclerotic heart disease of native coronary artery without angina pectoris: Secondary | ICD-10-CM | POA: Diagnosis not present

## 2011-11-05 DIAGNOSIS — I10 Essential (primary) hypertension: Secondary | ICD-10-CM | POA: Diagnosis not present

## 2011-11-05 NOTE — Assessment & Plan Note (Signed)
The patient is doing well overall. No symptoms suggestive of angina. Continue medical therapy.

## 2011-11-05 NOTE — Patient Instructions (Addendum)
Continue same medications.  Follow up in 6 months.  

## 2011-11-05 NOTE — Assessment & Plan Note (Signed)
He is on pravastatin. I recommend a target LDL of less than 70. This is being managed by Dr. Olena Leatherwood.

## 2011-11-05 NOTE — Assessment & Plan Note (Signed)
His blood pressure is currently well controlled. Continue current medications.

## 2011-11-05 NOTE — Progress Notes (Signed)
HPI  This is a 78 year old man who is here today for a routine followup visit. He has known history of coronary artery disease status post angioplasty and drug-eluting stent placement in 2009. No cardiac events since then. He also has hypertension and hyperlipidemia. Last year in September, he had atypical chest pain which likely was due to shingles. He had a nuclear stress test done which showed no evidence of ischemia. Overall, he is doing well. He denies any chest pain, dyspnea or palpitations.  No Known Allergies   Current Outpatient Prescriptions on File Prior to Visit  Medication Sig Dispense Refill  . amLODipine (NORVASC) 10 MG tablet Take 1 tablet (10 mg total) by mouth daily.  90 tablet  3  . aspirin 325 MG tablet Take 325 mg by mouth daily.        Marland Kitchen atenolol (TENORMIN) 50 MG tablet Take 1 tablet (50 mg total) by mouth daily.  90 tablet  3  . chlorthalidone (HYGROTON) 25 MG tablet Take 1 tablet (25 mg total) by mouth daily.  90 tablet  3  . cholecalciferol (VITAMIN D) 1000 UNITS tablet Take 2,000 Units by mouth daily.        . Glucosamine-Chondroit-Vit C-Mn (GLUCOSAMINE CHONDR 1500 COMPLX) CAPS Take 1 capsule by mouth daily.       . isosorbide mononitrate (IMDUR) 60 MG 24 hr tablet Take 60 mg by mouth daily.        . nitroGLYCERIN (NITROSTAT) 0.4 MG SL tablet Place 0.4 mg under the tongue every 5 (five) minutes as needed.        . pravastatin (PRAVACHOL) 40 MG tablet Take 1 tablet (40 mg total) by mouth every evening.  90 tablet  3  . terazosin (HYTRIN) 2 MG capsule Take 1 capsule (2 mg total) by mouth daily. (every morning)  30 capsule  6  . terazosin (HYTRIN) 5 MG capsule Take 5 mg by mouth at bedtime.        Marland Kitchen DISCONTD: lisinopril (PRINIVIL,ZESTRIL) 10 MG tablet Take 1 tablet (10 mg total) by mouth daily.  30 tablet  6     Past Medical History  Diagnosis Date  . Pure hypercholesterolemia   . Essential hypertension, benign     Poorly controlled  . Coronary artery disease  11/22/2007    Status post PCI with drug-eluting stent 2009, September 2012 negative outpatient stress test  . GERD (gastroesophageal reflux disease)   . Postherpetic neuralgia      History reviewed. No pertinent past surgical history.   Family History  Problem Relation Age of Onset  . Heart attack Sister   . Coronary artery disease Brother     CABG     History   Social History  . Marital Status: Married    Spouse Name: N/A    Number of Children: N/A  . Years of Education: N/A   Occupational History  . retired     work in Press photographer for 30 years   Social History Main Topics  . Smoking status: Former Smoker -- 0.5 packs/day for 25 years    Types: Cigarettes    Quit date: 02/16/1978  . Smokeless tobacco: Never Used  . Alcohol Use: Not on file  . Drug Use: Not on file  . Sexually Active: Not on file   Other Topics Concern  . Not on file   Social History Narrative  . No narrative on file     PHYSICAL EXAM   BP 138/78  Pulse  62  Ht 5' 10.5" (1.791 m)  Wt 191 lb (86.637 kg)  BMI 27.02 kg/m2  SpO2 96% Constitutional: He is oriented to person, place, and time. He appears well-developed and well-nourished. No distress.  HENT: No nasal discharge.  Head: Normocephalic and atraumatic.  Eyes: Pupils are equal and round. Right eye exhibits no discharge. Left eye exhibits no discharge.  Neck: Normal range of motion. Neck supple. No JVD present. No thyromegaly present.  Cardiovascular: Normal rate, regular rhythm, normal heart sounds and. Exam reveals no gallop and no friction rub. No murmur heard.  Pulmonary/Chest: Effort normal and breath sounds normal. No stridor. No respiratory distress. He has no wheezes. He has no rales. He exhibits no tenderness.  Abdominal: Soft. Bowel sounds are normal. He exhibits no distension. There is no tenderness. There is no rebound and no guarding.  Musculoskeletal: Normal range of motion. He exhibits no edema and no tenderness.    Neurological: He is alert and oriented to person, place, and time. Coordination normal.  Skin: Skin is warm and dry. No rash noted. He is not diaphoretic. No erythema. No pallor.  Psychiatric: He has a normal mood and affect. His behavior is normal. Judgment and thought content normal.        ASSESSMENT AND PLAN

## 2011-12-09 ENCOUNTER — Other Ambulatory Visit: Payer: Self-pay | Admitting: Cardiology

## 2011-12-09 DIAGNOSIS — I2510 Atherosclerotic heart disease of native coronary artery without angina pectoris: Secondary | ICD-10-CM

## 2011-12-09 MED ORDER — PRAVASTATIN SODIUM 40 MG PO TABS: 40.0000 mg | ORAL_TABLET | Freq: Every evening | ORAL | Status: DC

## 2011-12-10 DIAGNOSIS — Z23 Encounter for immunization: Secondary | ICD-10-CM | POA: Diagnosis not present

## 2011-12-21 ENCOUNTER — Other Ambulatory Visit: Payer: Self-pay | Admitting: Cardiology

## 2012-01-07 DIAGNOSIS — H52229 Regular astigmatism, unspecified eye: Secondary | ICD-10-CM | POA: Diagnosis not present

## 2012-01-07 DIAGNOSIS — H43819 Vitreous degeneration, unspecified eye: Secondary | ICD-10-CM | POA: Diagnosis not present

## 2012-01-07 DIAGNOSIS — H524 Presbyopia: Secondary | ICD-10-CM | POA: Diagnosis not present

## 2012-01-07 DIAGNOSIS — H5200 Hypermetropia, unspecified eye: Secondary | ICD-10-CM | POA: Diagnosis not present

## 2012-02-29 ENCOUNTER — Telehealth: Payer: Self-pay | Admitting: Cardiovascular Disease

## 2012-02-29 DIAGNOSIS — E299 Testicular dysfunction, unspecified: Secondary | ICD-10-CM | POA: Diagnosis not present

## 2012-02-29 MED ORDER — CHLORTHALIDONE 25 MG PO TABS: 25.0000 mg | ORAL_TABLET | Freq: Every day | ORAL | Status: DC

## 2012-02-29 MED ORDER — ATENOLOL 50 MG PO TABS: 50.0000 mg | ORAL_TABLET | Freq: Every day | ORAL | Status: DC

## 2012-02-29 MED ORDER — AMLODIPINE BESYLATE 10 MG PO TABS: 10.0000 mg | ORAL_TABLET | Freq: Every day | ORAL | Status: DC

## 2012-02-29 NOTE — Telephone Encounter (Signed)
REFILLS SEND TO [facility]  Amlodipine tab 10mg  - 1 tab daily  Atenolol tab 50 mg - 1 tab daily  Chlorthalid tab 25mg  - 1 tab daily

## 2012-03-18 DIAGNOSIS — I10 Essential (primary) hypertension: Secondary | ICD-10-CM | POA: Diagnosis not present

## 2012-06-01 ENCOUNTER — Other Ambulatory Visit: Payer: Self-pay | Admitting: Physician Assistant

## 2012-06-17 DIAGNOSIS — I10 Essential (primary) hypertension: Secondary | ICD-10-CM | POA: Diagnosis not present

## 2012-06-17 DIAGNOSIS — E782 Mixed hyperlipidemia: Secondary | ICD-10-CM | POA: Diagnosis not present

## 2012-08-09 ENCOUNTER — Other Ambulatory Visit: Payer: Self-pay | Admitting: Physician Assistant

## 2012-09-16 DIAGNOSIS — I10 Essential (primary) hypertension: Secondary | ICD-10-CM | POA: Diagnosis not present

## 2012-10-11 ENCOUNTER — Other Ambulatory Visit: Payer: Self-pay | Admitting: Cardiology

## 2012-10-11 ENCOUNTER — Other Ambulatory Visit: Payer: Self-pay | Admitting: Physician Assistant

## 2012-10-11 MED ORDER — AMLODIPINE BESYLATE 10 MG PO TABS: 10.0000 mg | ORAL_TABLET | Freq: Every day | ORAL | Status: DC

## 2012-10-11 MED ORDER — AMLODIPINE BESYLATE 10 MG PO TABS: 5.0000 mg | ORAL_TABLET | Freq: Every day | ORAL | Status: DC

## 2012-11-04 ENCOUNTER — Encounter: Payer: Self-pay | Admitting: Cardiovascular Disease

## 2012-11-04 ENCOUNTER — Ambulatory Visit (INDEPENDENT_AMBULATORY_CARE_PROVIDER_SITE_OTHER): Payer: Federal, State, Local not specified - PPO | Admitting: Cardiovascular Disease

## 2012-11-04 VITALS — BP 166/79 | HR 64 | Ht 70.5 in | Wt 199.0 lb

## 2012-11-04 DIAGNOSIS — I2510 Atherosclerotic heart disease of native coronary artery without angina pectoris: Secondary | ICD-10-CM

## 2012-11-04 NOTE — Patient Instructions (Signed)
Continue all current medications. Your physician wants you to follow up in:  1 year.  You will receive a reminder letter in the mail one-two months in advance.  If you don't receive a letter, please call our office to schedule the follow up appointment   

## 2012-11-04 NOTE — Progress Notes (Signed)
Patient ID: [name], male   DOB: 04-30-1933, 79 y.o.   MRN: [id number]   SUBJECTIVE: [name] is a 79 year old man who is here today for a routine followup visit. He has a known history of coronary artery disease status post angioplasty and drug-eluting stent placement in 2009. No cardiac events since then. He also has hypertension and hyperlipidemia. In September 2012, he had atypical chest pain which likely was due to shingles. He had a nuclear stress test done which showed no evidence of ischemia. Overall, he is doing well. He denies any chest pain, dyspnea or palpitations.  He built a garage last year, mows 4 lawns, and does a lot of work on his house. He says, "I want to live until I'm 89 or better".   No Known Allergies  Current Outpatient Prescriptions  Medication Sig Dispense Refill  . amLODipine (NORVASC) 10 MG tablet Take 1 tablet (10 mg total) by mouth daily.  30 tablet  3  . aspirin 81 MG tablet Take 81 mg by mouth daily.      Marland Kitchen atenolol (TENORMIN) 50 MG tablet TAKE 1 TABLET BY MOUTH DAILY ( CALL OFFICE TO SCHEDULE APPOINTMENT )  30 tablet  3  . chlorthalidone (HYGROTON) 25 MG tablet TAKE 1 TABLET BY MOUTH DAILY ( CALL OFFICE AND SCHEDULE APPOINTMENT )  30 tablet  3  . cholecalciferol (VITAMIN D) 1000 UNITS tablet Take 2,000 Units by mouth daily.        . Glucosamine-Chondroit-Vit C-Mn (GLUCOSAMINE CHONDR 1500 COMPLX) CAPS Take 1 capsule by mouth daily.       . isosorbide mononitrate (IMDUR) 60 MG 24 hr tablet Take 60 mg by mouth daily.        . nitroGLYCERIN (NITROSTAT) 0.4 MG SL tablet Place 0.4 mg under the tongue every 5 (five) minutes as needed.        . pravastatin (PRAVACHOL) 40 MG tablet Take 1 tablet (40 mg total) by mouth every evening.  90 tablet  3  . terazosin (HYTRIN) 2 MG capsule TAKE 1 CAPSULE BY MOUTH EVERY MORNING  30 capsule  2  . terazosin (HYTRIN) 5 MG capsule Take 5 mg by mouth at bedtime.        . [DISCONTINUED] lisinopril (PRINIVIL,ZESTRIL)  10 MG tablet Take 1 tablet (10 mg total) by mouth daily.  30 tablet  6   No current facility-administered medications for this visit.    Past Medical History  Diagnosis Date  . Pure hypercholesterolemia   . Essential hypertension, benign     Poorly controlled  . Coronary artery disease 11/22/2007    Status post PCI with drug-eluting stent 2009, September 2012 negative outpatient stress test  . GERD (gastroesophageal reflux disease)   . Postherpetic neuralgia     No past surgical history on file.  History   Social History  . Marital Status: Married    Spouse Name: N/A    Number of Children: N/A  . Years of Education: N/A   Occupational History  . retired     work in Press photographer for 30 years   Social History Main Topics  . Smoking status: Former Smoker -- 0.50 packs/day for 25 years    Types: Cigarettes    Quit date: 02/16/1978  . Smokeless tobacco: Never Used  . Alcohol Use: Not on file  . Drug Use: Not on file  . Sexual Activity: Not on file   Other Topics Concern  . Not on file   Social  History Narrative  . No narrative on file     Filed Vitals:   11/04/12 1258  BP: 166/79  Pulse: 64  Height: 5' 10.5" (1.791 m)  Weight: 199 lb (90.266 kg)    PHYSICAL EXAM General: NAD Neck: No JVD, no thyromegaly or thyroid nodule.  Lungs: Clear to auscultation bilaterally with normal respiratory effort. CV: Nondisplaced PMI.  Heart regular S1/S2, no S3/S4, no murmur.  No peripheral edema.  No carotid bruit.  Normal pedal pulses.  Abdomen: Soft, nontender, no hepatosplenomegaly, no distention.  Neurologic: Alert and oriented x 3.  Psych: Normal affect. Extremities: No clubbing or cyanosis.   ECG: reviewed and available in electronic records.      ASSESSMENT AND PLAN: 1. CAD: stable on current medical therapy. No changes to current medical regimen, which includes ASA, atenolol, Imdur, and pravastatin. 2. HTN: uncontrolled today, but he checks it at home regularly  and it is normal. I told him if that it is persistently elevated at home, I will start lisinopril 5 mg daily. Continue current doses of chlorthalidone and amlodipine. 3. Hyperlipidemia: on pravastatin. Will check to see if any recent lipids were checked by PCP.   Prentice Docker, M.D., F.A.C.C.

## 2012-12-19 DIAGNOSIS — Z6828 Body mass index (BMI) 28.0-28.9, adult: Secondary | ICD-10-CM | POA: Diagnosis not present

## 2012-12-19 DIAGNOSIS — M76899 Other specified enthesopathies of unspecified lower limb, excluding foot: Secondary | ICD-10-CM | POA: Diagnosis not present

## 2012-12-22 ENCOUNTER — Other Ambulatory Visit: Payer: Self-pay | Admitting: Cardiovascular Disease

## 2012-12-22 DIAGNOSIS — Z23 Encounter for immunization: Secondary | ICD-10-CM | POA: Diagnosis not present

## 2012-12-23 ENCOUNTER — Other Ambulatory Visit: Payer: Self-pay | Admitting: *Deleted

## 2012-12-23 ENCOUNTER — Other Ambulatory Visit: Payer: Self-pay | Admitting: Cardiovascular Disease

## 2012-12-23 MED ORDER — TERAZOSIN HCL 2 MG PO CAPS: ORAL_CAPSULE | ORAL | Status: DC

## 2012-12-23 NOTE — Telephone Encounter (Signed)
Requested Prescriptions   Signed Prescriptions Disp Refills  . terazosin (HYTRIN) 2 MG capsule 30 capsule 2    Sig: TAKE 1 CAPSULE BY MOUTH EVERY MORNING    Authorizing Provider: Prentice Docker A    Ordering User: Kendrick Fries

## 2013-01-05 ENCOUNTER — Other Ambulatory Visit: Payer: Self-pay | Admitting: Physician Assistant

## 2013-02-20 ENCOUNTER — Other Ambulatory Visit: Payer: Self-pay | Admitting: Physician Assistant

## 2013-03-24 DIAGNOSIS — I10 Essential (primary) hypertension: Secondary | ICD-10-CM | POA: Diagnosis not present

## 2013-03-24 DIAGNOSIS — Z6828 Body mass index (BMI) 28.0-28.9, adult: Secondary | ICD-10-CM | POA: Diagnosis not present

## 2013-03-28 DIAGNOSIS — L851 Acquired keratosis [keratoderma] palmaris et plantaris: Secondary | ICD-10-CM | POA: Diagnosis not present

## 2013-03-28 DIAGNOSIS — L570 Actinic keratosis: Secondary | ICD-10-CM | POA: Diagnosis not present

## 2013-03-28 DIAGNOSIS — L820 Inflamed seborrheic keratosis: Secondary | ICD-10-CM | POA: Diagnosis not present

## 2013-03-28 DIAGNOSIS — L821 Other seborrheic keratosis: Secondary | ICD-10-CM | POA: Diagnosis not present

## 2013-03-28 DIAGNOSIS — D485 Neoplasm of uncertain behavior of skin: Secondary | ICD-10-CM | POA: Diagnosis not present

## 2013-06-27 DIAGNOSIS — L570 Actinic keratosis: Secondary | ICD-10-CM | POA: Diagnosis not present

## 2013-06-27 DIAGNOSIS — Z6828 Body mass index (BMI) 28.0-28.9, adult: Secondary | ICD-10-CM | POA: Diagnosis not present

## 2013-06-27 DIAGNOSIS — I10 Essential (primary) hypertension: Secondary | ICD-10-CM | POA: Diagnosis not present

## 2013-09-28 DIAGNOSIS — Z6827 Body mass index (BMI) 27.0-27.9, adult: Secondary | ICD-10-CM | POA: Diagnosis not present

## 2013-09-28 DIAGNOSIS — I10 Essential (primary) hypertension: Secondary | ICD-10-CM | POA: Diagnosis not present

## 2013-09-28 DIAGNOSIS — M109 Gout, unspecified: Secondary | ICD-10-CM | POA: Diagnosis not present

## 2013-10-23 ENCOUNTER — Other Ambulatory Visit: Payer: Self-pay | Admitting: Cardiovascular Disease

## 2013-11-09 ENCOUNTER — Ambulatory Visit (INDEPENDENT_AMBULATORY_CARE_PROVIDER_SITE_OTHER): Payer: Federal, State, Local not specified - PPO | Admitting: Cardiovascular Disease

## 2013-11-09 ENCOUNTER — Encounter: Payer: Self-pay | Admitting: Cardiovascular Disease

## 2013-11-09 VITALS — BP 138/73 | HR 62 | Ht 70.0 in | Wt 198.0 lb

## 2013-11-09 DIAGNOSIS — E785 Hyperlipidemia, unspecified: Secondary | ICD-10-CM | POA: Diagnosis not present

## 2013-11-09 DIAGNOSIS — I10 Essential (primary) hypertension: Secondary | ICD-10-CM | POA: Diagnosis not present

## 2013-11-09 DIAGNOSIS — I2510 Atherosclerotic heart disease of native coronary artery without angina pectoris: Secondary | ICD-10-CM | POA: Diagnosis not present

## 2013-11-09 NOTE — Patient Instructions (Signed)
Continue all current medications. Your physician wants you to follow up in:  1 year.  You will receive a reminder letter in the mail one-two months in advance.  If you don't receive a letter, please call our office to schedule the follow up appointment   

## 2013-11-09 NOTE — Progress Notes (Signed)
Patient ID: [name], male   DOB: 06/27/1933, 80 y.o.   MRN: [id number]      SUBJECTIVE: [name] is a 80 year old man who is here today for a routine followup visit. He has a known history of coronary artery disease status post angioplasty and drug-eluting stent placement in 2009. He has had no cardiac events since then. He also has hypertension and hyperlipidemia. In September 2012, he had atypical chest pain which likely was due to shingles. He had a nuclear stress test done which showed no evidence of ischemia. Overall, he is doing well. He denies any chest pain, dyspnea or palpitations. ECG performed in the office today demonstrates sinus bradycardia, heart rate 57 beats per minute, with no ischemic ST segment or T-wave abnormalities. He recently built a deck and patio and remains active doing household activities.  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   Review of Systems: As per "subjective", otherwise negative.  No Known Allergies  Current Outpatient Prescriptions  Medication Sig Dispense Refill  . amLODipine (NORVASC) 10 MG tablet TAKE 1 TABLET BY MOUTH EVERY DAY  30 tablet  6  . aspirin 81 MG tablet Take 81 mg by mouth daily.      Marland Kitchen atenolol (TENORMIN) 50 MG tablet TAKE 1 TABLET BY MOUTH DAILY ( CALL OFFICE TO SCHEDULE APPOINTMENT )  30 tablet  6  . chlorthalidone (HYGROTON) 25 MG tablet TAKE 1 TABLET BY MOUTH DAILY ( CALL OFFICE AND SCHEDULE APPOINTMENT )  30 tablet  6  . cholecalciferol (VITAMIN D) 1000 UNITS tablet Take 2,000 Units by mouth daily.        . isosorbide mononitrate (IMDUR) 60 MG 24 hr tablet Take 60 mg by mouth daily.        . nitroGLYCERIN (NITROSTAT) 0.4 MG SL tablet Place 0.4 mg under the tongue every 5 (five) minutes as needed.        . pravastatin (PRAVACHOL) 40 MG tablet TAKE 1 TABLET BY MOUTH EVERY EVENING.  90 tablet  3  . terazosin (HYTRIN) 5 MG capsule Take 5 mg by mouth at bedtime.        . [DISCONTINUED] lisinopril (PRINIVIL,ZESTRIL) 10  MG tablet Take 1 tablet (10 mg total) by mouth daily.  30 tablet  6   No current facility-administered medications for this visit.    Past Medical History  Diagnosis Date  . Pure hypercholesterolemia   . Essential hypertension, benign     Poorly controlled  . Coronary artery disease 11/22/2007    Status post PCI with drug-eluting stent 2009, September 2012 negative outpatient stress test  . GERD (gastroesophageal reflux disease)   . Postherpetic neuralgia     No past surgical history on file.  History   Social History  . Marital Status: Married    Spouse Name: N/A    Number of Children: N/A  . Years of Education: N/A   Occupational History  . retired     work in Press photographer for 30 years   Social History Main Topics  . Smoking status: Former Smoker -- 0.50 packs/day for 25 years    Types: Cigarettes    Quit date: 02/16/1978  . Smokeless tobacco: Never Used  . Alcohol Use: No  . Drug Use: No  . Sexual Activity: Not on file   Other Topics Concern  . Not on file   Social History Narrative  . No narrative on file     Filed Vitals:   11/09/13 1308  BP: 138/73  Pulse: 62  Height: 5\' 10"  (1.778 m)  Weight: 198 lb (89.812 kg)  SpO2: 96%    PHYSICAL EXAM General: NAD HEENT: Normal. Neck: No JVD, no thyromegaly. Lungs: Clear to auscultation bilaterally with normal respiratory effort. CV: Nondisplaced PMI.  Regular rate and rhythm, normal S1/S2, no S3/S4, no murmur. No pretibial or periankle edema.  No carotid bruit.  Normal pedal pulses.  Abdomen: Soft, nontender, no hepatosplenomegaly, no distention.  Neurologic: Alert and oriented x 3.  Psych: Normal affect. Skin: Normal. Musculoskeletal: Normal range of motion, no gross deformities. Extremities: No clubbing or cyanosis.   ECG: Most recent ECG reviewed.      ASSESSMENT AND PLAN: 1. CAD: Stable ischemic heart disesase on current medical therapy. No changes to current medical regimen, which includes ASA,  atenolol, Imdur, and pravastatin.  2. Essential HTN: Controlled on current doses of chlorthalidone and amlodipine.  3. Hyperlipidemia: On pravastatin. Will check to see if any recent lipids were checked by PCP.  Dispo: f/u 1 year.  Prentice Docker, M.D., F.A.C.C.

## 2014-01-29 ENCOUNTER — Other Ambulatory Visit: Payer: Self-pay | Admitting: Cardiovascular Disease

## 2014-02-26 DIAGNOSIS — M25562 Pain in left knee: Secondary | ICD-10-CM | POA: Diagnosis not present

## 2014-02-26 DIAGNOSIS — M1712 Unilateral primary osteoarthritis, left knee: Secondary | ICD-10-CM | POA: Diagnosis not present

## 2014-03-15 ENCOUNTER — Telehealth: Payer: Self-pay | Admitting: *Deleted

## 2014-03-15 MED ORDER — NITROGLYCERIN 0.4 MG SL SUBL: 0.4000 mg | SUBLINGUAL_TABLET | SUBLINGUAL | Status: AC | PRN

## 2014-03-15 NOTE — Telephone Encounter (Signed)
Pharmacist called for OK to fill sublingual nitroglycerin. Pt has not had it filled since 2011. Pt has not had to take any and has not had any chest pain, but wants to have available if needed. Will send refill to [facility]. Pt made aware

## 2014-03-28 DIAGNOSIS — L918 Other hypertrophic disorders of the skin: Secondary | ICD-10-CM | POA: Diagnosis not present

## 2014-03-28 DIAGNOSIS — L570 Actinic keratosis: Secondary | ICD-10-CM | POA: Diagnosis not present

## 2014-03-28 DIAGNOSIS — L821 Other seborrheic keratosis: Secondary | ICD-10-CM | POA: Diagnosis not present

## 2014-04-06 DIAGNOSIS — Z6828 Body mass index (BMI) 28.0-28.9, adult: Secondary | ICD-10-CM | POA: Diagnosis not present

## 2014-04-06 DIAGNOSIS — I10 Essential (primary) hypertension: Secondary | ICD-10-CM | POA: Diagnosis not present

## 2014-05-28 DIAGNOSIS — M1712 Unilateral primary osteoarthritis, left knee: Secondary | ICD-10-CM | POA: Diagnosis not present

## 2014-05-29 ENCOUNTER — Other Ambulatory Visit: Payer: Self-pay | Admitting: Cardiovascular Disease

## 2014-09-12 DIAGNOSIS — M1712 Unilateral primary osteoarthritis, left knee: Secondary | ICD-10-CM | POA: Diagnosis not present

## 2014-10-11 DIAGNOSIS — M150 Primary generalized (osteo)arthritis: Secondary | ICD-10-CM | POA: Diagnosis not present

## 2014-10-11 DIAGNOSIS — M109 Gout, unspecified: Secondary | ICD-10-CM | POA: Diagnosis not present

## 2014-10-11 DIAGNOSIS — I10 Essential (primary) hypertension: Secondary | ICD-10-CM | POA: Diagnosis not present

## 2014-10-11 DIAGNOSIS — Z6827 Body mass index (BMI) 27.0-27.9, adult: Secondary | ICD-10-CM | POA: Diagnosis not present

## 2014-11-07 DIAGNOSIS — Z23 Encounter for immunization: Secondary | ICD-10-CM | POA: Diagnosis not present

## 2014-11-12 ENCOUNTER — Encounter: Payer: Self-pay | Admitting: *Deleted

## 2014-11-12 ENCOUNTER — Ambulatory Visit (INDEPENDENT_AMBULATORY_CARE_PROVIDER_SITE_OTHER): Payer: Federal, State, Local not specified - PPO | Admitting: Cardiovascular Disease

## 2014-11-12 ENCOUNTER — Encounter: Payer: Self-pay | Admitting: Cardiovascular Disease

## 2014-11-12 VITALS — BP 137/75 | HR 60 | Ht 70.0 in | Wt 192.0 lb

## 2014-11-12 DIAGNOSIS — E785 Hyperlipidemia, unspecified: Secondary | ICD-10-CM | POA: Diagnosis not present

## 2014-11-12 DIAGNOSIS — I2510 Atherosclerotic heart disease of native coronary artery without angina pectoris: Secondary | ICD-10-CM | POA: Diagnosis not present

## 2014-11-12 DIAGNOSIS — I10 Essential (primary) hypertension: Secondary | ICD-10-CM

## 2014-11-12 NOTE — Patient Instructions (Signed)

## 2014-11-12 NOTE — Progress Notes (Signed)
Patient ID: [name], male   DOB: April 17, 1933, 81 y.o.   MRN: [id number]      SUBJECTIVE: [name] presents for a routine followup visit. He has a history of coronary artery disease status post angioplasty and drug-eluting stent placement in 2009. He has had no cardiac events since then. His symptoms prior to stent placement were fatigue and shortness of breath.  He also has hypertension and hyperlipidemia. In September 2012, he had atypical chest pain which likely was due to shingles. He had a nuclear stress test done which showed no evidence of ischemia.   Overall, he is doing well. He denies any chest pain, dyspnea or palpitations. He remains active with outdoor activities around his house.  ECG performed in the office today demonstrates sinus rhythm with no ischemic ST segment or T-wave abnormalities. There is some suggestion of old anterior infarct with late R-wave progression.   Review of Systems: As per "subjective", otherwise negative.  No Known Allergies  Current Outpatient Prescriptions  Medication Sig Dispense Refill  . allopurinol (ZYLOPRIM) 300 MG tablet Take 1 tablet by mouth daily.    Marland Kitchen amLODipine (NORVASC) 10 MG tablet Take 1 tablet (10 mg total) by mouth daily. 30 tablet 6  . aspirin 81 MG tablet Take 81 mg by mouth daily.    Marland Kitchen atenolol (TENORMIN) 50 MG tablet Take 1 tablet (50 mg total) by mouth daily. 30 tablet 6  . chlorthalidone (HYGROTON) 25 MG tablet Take 1 tablet (25 mg total) by mouth daily. 30 tablet 6  . cholecalciferol (VITAMIN D) 1000 UNITS tablet Take 2,000 Units by mouth daily.      . isosorbide mononitrate (IMDUR) 60 MG 24 hr tablet Take 60 mg by mouth daily.      . meloxicam (MOBIC) 7.5 MG tablet Take 1 tablet by mouth daily.    . nitroGLYCERIN (NITROSTAT) 0.4 MG SL tablet Place 1 tablet (0.4 mg total) under the tongue every 5 (five) minutes as needed. 25 tablet 3  . pravastatin (PRAVACHOL) 40 MG tablet TAKE 1 TABLET BY MOUTH EVERY EVENING.  90 tablet 3  . terazosin (HYTRIN) 5 MG capsule Take 5 mg by mouth at bedtime.      . [DISCONTINUED] lisinopril (PRINIVIL,ZESTRIL) 10 MG tablet Take 1 tablet (10 mg total) by mouth daily. 30 tablet 6   No current facility-administered medications for this visit.    Past Medical History  Diagnosis Date  . Pure hypercholesterolemia   . Essential hypertension, benign     Poorly controlled  . Coronary artery disease 11/22/2007    Status post PCI with drug-eluting stent 2009, September 2012 negative outpatient stress test  . GERD (gastroesophageal reflux disease)   . Postherpetic neuralgia     No past surgical history on file.  Social History   Social History  . Marital Status: Married    Spouse Name: N/A  . Number of Children: N/A  . Years of Education: N/A   Occupational History  . retired     work in Press photographer for 30 years   Social History Main Topics  . Smoking status: Former Smoker -- 0.50 packs/day for 25 years    Types: Cigarettes    Start date: 09/26/1956    Quit date: 02/16/1978  . Smokeless tobacco: Never Used  . Alcohol Use: No  . Drug Use: No  . Sexual Activity: Not on file   Other Topics Concern  . Not on file   Social History Narrative  Filed Vitals:   11/12/14 0810  BP: 137/75  Pulse: 60  Height: 5\' 10"  (1.778 m)  Weight: 192 lb (87.091 kg)    PHYSICAL EXAM General: NAD HEENT: Normal. Neck: No JVD, no thyromegaly. Lungs: Clear to auscultation bilaterally with normal respiratory effort. CV: Nondisplaced PMI.  Regular rate and rhythm, normal S1/S2, no S3/S4, no murmur. No pretibial or periankle edema.  No carotid bruit.  Normal pedal pulses.  Abdomen: Soft, nontender, no hepatosplenomegaly, no distention.  Neurologic: Alert and oriented x 3.  Psych: Normal affect. Skin: Normal. Musculoskeletal: Normal range of motion, no gross deformities. Extremities: No clubbing or cyanosis.   ECG: Most recent ECG reviewed.      ASSESSMENT AND  PLAN: 1. CAD: Stable ischemic heart disesase on current medical therapy. No changes to current medical regimen, which includes ASA, atenolol, Imdur, and pravastatin.   2. Essential HTN: Controlled on current doses of chlorthalidone and amlodipine.   3. Hyperlipidemia: On pravastatin. Lipids checked by PCP 2 weeks ago. Will obtain copy.  Dispo: f/u 1 year.  Prentice Docker, M.D., F.A.C.C.

## 2015-01-01 ENCOUNTER — Other Ambulatory Visit: Payer: Self-pay | Admitting: Cardiovascular Disease

## 2015-01-14 DIAGNOSIS — Z6827 Body mass index (BMI) 27.0-27.9, adult: Secondary | ICD-10-CM | POA: Diagnosis not present

## 2015-01-14 DIAGNOSIS — M1009 Idiopathic gout, multiple sites: Secondary | ICD-10-CM | POA: Diagnosis not present

## 2015-01-14 DIAGNOSIS — I10 Essential (primary) hypertension: Secondary | ICD-10-CM | POA: Diagnosis not present

## 2015-01-14 DIAGNOSIS — M1712 Unilateral primary osteoarthritis, left knee: Secondary | ICD-10-CM | POA: Diagnosis not present

## 2015-01-14 DIAGNOSIS — M150 Primary generalized (osteo)arthritis: Secondary | ICD-10-CM | POA: Diagnosis not present

## 2015-01-31 ENCOUNTER — Other Ambulatory Visit: Payer: Self-pay | Admitting: Cardiovascular Disease

## 2015-04-03 DIAGNOSIS — L570 Actinic keratosis: Secondary | ICD-10-CM | POA: Diagnosis not present

## 2015-04-19 DIAGNOSIS — I10 Essential (primary) hypertension: Secondary | ICD-10-CM | POA: Diagnosis not present

## 2015-04-19 DIAGNOSIS — Z6828 Body mass index (BMI) 28.0-28.9, adult: Secondary | ICD-10-CM | POA: Diagnosis not present

## 2015-04-19 DIAGNOSIS — M170 Bilateral primary osteoarthritis of knee: Secondary | ICD-10-CM | POA: Diagnosis not present

## 2015-04-19 DIAGNOSIS — M1009 Idiopathic gout, multiple sites: Secondary | ICD-10-CM | POA: Diagnosis not present

## 2015-05-29 DIAGNOSIS — M1712 Unilateral primary osteoarthritis, left knee: Secondary | ICD-10-CM | POA: Diagnosis not present

## 2015-07-23 DIAGNOSIS — M1009 Idiopathic gout, multiple sites: Secondary | ICD-10-CM | POA: Diagnosis not present

## 2015-07-23 DIAGNOSIS — Z6827 Body mass index (BMI) 27.0-27.9, adult: Secondary | ICD-10-CM | POA: Diagnosis not present

## 2015-07-23 DIAGNOSIS — M170 Bilateral primary osteoarthritis of knee: Secondary | ICD-10-CM | POA: Diagnosis not present

## 2015-07-23 DIAGNOSIS — I10 Essential (primary) hypertension: Secondary | ICD-10-CM | POA: Diagnosis not present

## 2015-07-30 ENCOUNTER — Other Ambulatory Visit: Payer: Self-pay | Admitting: Cardiovascular Disease

## 2015-09-26 DIAGNOSIS — M1712 Unilateral primary osteoarthritis, left knee: Secondary | ICD-10-CM | POA: Diagnosis not present

## 2015-10-25 DIAGNOSIS — M1009 Idiopathic gout, multiple sites: Secondary | ICD-10-CM | POA: Diagnosis not present

## 2015-10-25 DIAGNOSIS — I10 Essential (primary) hypertension: Secondary | ICD-10-CM | POA: Diagnosis not present

## 2015-10-25 DIAGNOSIS — Z6827 Body mass index (BMI) 27.0-27.9, adult: Secondary | ICD-10-CM | POA: Diagnosis not present

## 2015-10-25 DIAGNOSIS — M170 Bilateral primary osteoarthritis of knee: Secondary | ICD-10-CM | POA: Diagnosis not present

## 2015-10-30 DIAGNOSIS — M1712 Unilateral primary osteoarthritis, left knee: Secondary | ICD-10-CM | POA: Diagnosis not present

## 2015-10-30 DIAGNOSIS — M25562 Pain in left knee: Secondary | ICD-10-CM | POA: Diagnosis not present

## 2015-11-01 ENCOUNTER — Other Ambulatory Visit: Payer: Self-pay | Admitting: *Deleted

## 2015-11-01 MED ORDER — METOPROLOL TARTRATE 50 MG PO TABS: 50.0000 mg | ORAL_TABLET | Freq: Two times a day (BID) | ORAL | 6 refills | Status: DC

## 2015-11-06 ENCOUNTER — Encounter: Payer: Self-pay | Admitting: *Deleted

## 2015-11-07 ENCOUNTER — Ambulatory Visit (INDEPENDENT_AMBULATORY_CARE_PROVIDER_SITE_OTHER): Payer: Medicare Other | Admitting: Cardiovascular Disease

## 2015-11-07 ENCOUNTER — Encounter: Payer: Self-pay | Admitting: Cardiovascular Disease

## 2015-11-07 VITALS — BP 148/84 | HR 63 | Ht 70.0 in | Wt 196.0 lb

## 2015-11-07 DIAGNOSIS — E785 Hyperlipidemia, unspecified: Secondary | ICD-10-CM | POA: Diagnosis not present

## 2015-11-07 DIAGNOSIS — I2510 Atherosclerotic heart disease of native coronary artery without angina pectoris: Secondary | ICD-10-CM | POA: Diagnosis not present

## 2015-11-07 DIAGNOSIS — I10 Essential (primary) hypertension: Secondary | ICD-10-CM

## 2015-11-07 NOTE — Progress Notes (Signed)
SUBJECTIVE: Mr. Canner presents for a routine followup visit. He has a history of coronary artery disease status post angioplasty and drug-eluting stent placement in 2009. He has had no cardiac events since then. His symptoms prior to stent placement were fatigue and shortness of breath.  He also has hypertension and hyperlipidemia. In September 2012, he had atypical chest pain which likely was due to shingles. He had a nuclear stress test done which showed no evidence of ischemia.   Overall, he is doing well. He denies any chest pain, dyspnea or palpitations.  He stays very active building decks and garages around his house as well as building pathways. He stays active on a daily basis.  ECG performed in the office today which I personally reviewed demonstrates normal sinus rhythm with no ischemic ST segment or T-wave abnormalities, nor any arrhythmias.   Soc: Married x 20 yrs, divorced, now remarried > 30 years. Served a year in [place] in late 1980's.    Review of Systems: As per "subjective", otherwise negative.  No Known Allergies  Current Outpatient Prescriptions  Medication Sig Dispense Refill  . allopurinol (ZYLOPRIM) 300 MG tablet Take 1 tablet by mouth daily.    Marland Kitchen amLODipine (NORVASC) 10 MG tablet TAKE 1 TABLET BY MOUTH DAILY. 30 tablet 6  . aspirin 81 MG tablet Take 81 mg by mouth daily.    . chlorthalidone (HYGROTON) 25 MG tablet TAKE 1 TABLET BY MOUTH EVERY DAY 30 tablet 6  . cholecalciferol (VITAMIN D) 1000 UNITS tablet Take 2,000 Units by mouth daily.      . isosorbide mononitrate (IMDUR) 60 MG 24 hr tablet Take 60 mg by mouth daily.      . meloxicam (MOBIC) 7.5 MG tablet Take 1 tablet by mouth daily.    . metoprolol (LOPRESSOR) 50 MG tablet Take 1 tablet (50 mg total) by mouth 2 (two) times daily. 60 tablet 6  . nitroGLYCERIN (NITROSTAT) 0.4 MG SL tablet Place 1 tablet (0.4 mg total) under the tongue every 5 (five) minutes as needed. 25 tablet 3  . pravastatin  (PRAVACHOL) 40 MG tablet TAKE 1 TABLET BY MOUTH EVERY EVENING. 90 tablet 3  . terazosin (HYTRIN) 5 MG capsule Take 5 mg by mouth at bedtime.       No current facility-administered medications for this visit.     Past Medical History:  Diagnosis Date  . Coronary artery disease 11/22/2007   Status post PCI with drug-eluting stent 2009, September 2012 negative outpatient stress test  . Essential hypertension, benign    Poorly controlled  . GERD (gastroesophageal reflux disease)   . Postherpetic neuralgia   . Pure hypercholesterolemia     No past surgical history on file.  Social History   Social History  . Marital status: Married    Spouse name: N/A  . Number of children: N/A  . Years of education: N/A   Occupational History  . retired     work in Tourist information centre manager for 30 years   Social History Main Topics  . Smoking status: Former Smoker    Packs/day: 0.50    Years: 25.00    Types: Cigarettes    Start date: 09/26/1956    Quit date: 02/16/1978  . Smokeless tobacco: Never Used  . Alcohol use No  . Drug use: No  . Sexual activity: Not on file   Other Topics Concern  . Not on file   Social History Narrative  . No narrative on file  Vitals:   11/07/15 0837  BP: (!) 148/84  Pulse: 63  SpO2: 94%  Weight: 196 lb (88.9 kg)  Height: 5\' 10"  (1.778 m)    PHYSICAL EXAM General: NAD HEENT: Normal. Neck: No JVD, no thyromegaly. Lungs: Clear to auscultation bilaterally with normal respiratory effort. CV: Nondisplaced PMI.  Regular rate and rhythm, normal S1/S2, no S3/S4, no murmur. No pretibial or periankle edema.  No carotid bruit.   Abdomen: Soft, nontender, no distention.  Neurologic: Alert and oriented.  Psych: Normal affect. Skin: Normal. Musculoskeletal: No gross deformities.    ECG: Most recent ECG reviewed.      ASSESSMENT AND PLAN: 1. CAD: Stable ischemic heart disesase on current medical therapy. No changes to current medical regimen, which includes ASA,  metoprolol, Imdur, and pravastatin.   2. Essential HTN: Controlled on current doses of chlorthalidone and amlodipine. No changes.  3. Hyperlipidemia: On pravastatin.   Dispo: f/u 1 year.   Kate Sable, M.D., F.A.C.C.

## 2015-11-07 NOTE — Patient Instructions (Signed)

## 2016-01-15 DIAGNOSIS — Z23 Encounter for immunization: Secondary | ICD-10-CM | POA: Diagnosis not present

## 2016-01-27 ENCOUNTER — Other Ambulatory Visit: Payer: Self-pay | Admitting: Cardiovascular Disease

## 2016-01-31 DIAGNOSIS — M1009 Idiopathic gout, multiple sites: Secondary | ICD-10-CM | POA: Diagnosis not present

## 2016-01-31 DIAGNOSIS — M170 Bilateral primary osteoarthritis of knee: Secondary | ICD-10-CM | POA: Diagnosis not present

## 2016-01-31 DIAGNOSIS — Z6827 Body mass index (BMI) 27.0-27.9, adult: Secondary | ICD-10-CM | POA: Diagnosis not present

## 2016-01-31 DIAGNOSIS — I10 Essential (primary) hypertension: Secondary | ICD-10-CM | POA: Diagnosis not present

## 2016-03-05 ENCOUNTER — Other Ambulatory Visit: Payer: Self-pay | Admitting: Cardiovascular Disease

## 2016-04-01 DIAGNOSIS — L821 Other seborrheic keratosis: Secondary | ICD-10-CM | POA: Diagnosis not present

## 2016-04-01 DIAGNOSIS — L570 Actinic keratosis: Secondary | ICD-10-CM | POA: Diagnosis not present

## 2016-04-27 DIAGNOSIS — M1712 Unilateral primary osteoarthritis, left knee: Secondary | ICD-10-CM | POA: Diagnosis not present

## 2016-04-27 DIAGNOSIS — M25562 Pain in left knee: Secondary | ICD-10-CM | POA: Diagnosis not present

## 2016-05-06 DIAGNOSIS — Z131 Encounter for screening for diabetes mellitus: Secondary | ICD-10-CM | POA: Diagnosis not present

## 2016-05-06 DIAGNOSIS — M170 Bilateral primary osteoarthritis of knee: Secondary | ICD-10-CM | POA: Diagnosis not present

## 2016-05-06 DIAGNOSIS — Z6827 Body mass index (BMI) 27.0-27.9, adult: Secondary | ICD-10-CM | POA: Diagnosis not present

## 2016-05-06 DIAGNOSIS — Z0000 Encounter for general adult medical examination without abnormal findings: Secondary | ICD-10-CM | POA: Diagnosis not present

## 2016-05-06 DIAGNOSIS — I10 Essential (primary) hypertension: Secondary | ICD-10-CM | POA: Diagnosis not present

## 2016-05-06 DIAGNOSIS — M1009 Idiopathic gout, multiple sites: Secondary | ICD-10-CM | POA: Diagnosis not present

## 2016-07-15 ENCOUNTER — Other Ambulatory Visit: Payer: Self-pay | Admitting: Cardiovascular Disease

## 2016-08-13 DIAGNOSIS — M170 Bilateral primary osteoarthritis of knee: Secondary | ICD-10-CM | POA: Diagnosis not present

## 2016-08-13 DIAGNOSIS — J440 Chronic obstructive pulmonary disease with acute lower respiratory infection: Secondary | ICD-10-CM | POA: Diagnosis not present

## 2016-08-13 DIAGNOSIS — R0602 Shortness of breath: Secondary | ICD-10-CM | POA: Diagnosis not present

## 2016-08-13 DIAGNOSIS — I10 Essential (primary) hypertension: Secondary | ICD-10-CM | POA: Diagnosis not present

## 2016-08-13 DIAGNOSIS — M1009 Idiopathic gout, multiple sites: Secondary | ICD-10-CM | POA: Diagnosis not present

## 2016-08-14 DIAGNOSIS — R079 Chest pain, unspecified: Secondary | ICD-10-CM | POA: Diagnosis not present

## 2016-09-08 DIAGNOSIS — M1712 Unilateral primary osteoarthritis, left knee: Secondary | ICD-10-CM | POA: Diagnosis not present

## 2016-09-08 DIAGNOSIS — M25562 Pain in left knee: Secondary | ICD-10-CM | POA: Diagnosis not present

## 2016-09-23 ENCOUNTER — Other Ambulatory Visit: Payer: Self-pay | Admitting: Cardiovascular Disease

## 2016-10-06 DIAGNOSIS — H25813 Combined forms of age-related cataract, bilateral: Secondary | ICD-10-CM | POA: Diagnosis not present

## 2016-10-06 DIAGNOSIS — H43813 Vitreous degeneration, bilateral: Secondary | ICD-10-CM | POA: Diagnosis not present

## 2016-10-22 ENCOUNTER — Other Ambulatory Visit: Payer: Self-pay | Admitting: Cardiovascular Disease

## 2016-11-10 ENCOUNTER — Telehealth: Payer: Self-pay | Admitting: Cardiovascular Disease

## 2016-11-10 ENCOUNTER — Encounter: Payer: Self-pay | Admitting: *Deleted

## 2016-11-10 ENCOUNTER — Ambulatory Visit (INDEPENDENT_AMBULATORY_CARE_PROVIDER_SITE_OTHER): Payer: Medicare Other | Admitting: Cardiovascular Disease

## 2016-11-10 ENCOUNTER — Encounter: Payer: Self-pay | Admitting: Cardiovascular Disease

## 2016-11-10 VITALS — BP 160/80 | HR 59 | Ht 70.0 in | Wt 191.0 lb

## 2016-11-10 DIAGNOSIS — R0609 Other forms of dyspnea: Secondary | ICD-10-CM

## 2016-11-10 DIAGNOSIS — I209 Angina pectoris, unspecified: Secondary | ICD-10-CM | POA: Diagnosis not present

## 2016-11-10 DIAGNOSIS — I10 Essential (primary) hypertension: Secondary | ICD-10-CM | POA: Diagnosis not present

## 2016-11-10 DIAGNOSIS — I25118 Atherosclerotic heart disease of native coronary artery with other forms of angina pectoris: Secondary | ICD-10-CM | POA: Diagnosis not present

## 2016-11-10 DIAGNOSIS — E785 Hyperlipidemia, unspecified: Secondary | ICD-10-CM | POA: Diagnosis not present

## 2016-11-10 DIAGNOSIS — Z23 Encounter for immunization: Secondary | ICD-10-CM | POA: Diagnosis not present

## 2016-11-10 NOTE — Progress Notes (Signed)
SUBJECTIVE: [name] presents for a routine followup visit. He has a history of coronary artery disease status post angioplasty and drug-eluting stent placement in 2009. He has had no cardiac events since then. His symptoms prior to stent placement were fatigue and shortness of breath.  He also has hypertension and hyperlipidemia. In September 2012, he had atypical chest pain which likely was due to shingles. He had a nuclear stress test done which showed no evidence of ischemia.   For the past few months, he has been experiencing progressive exertional dyspnea. He said it feels like symptoms which he experienced prior to stent placement in 2009. He denies exertional chest pain. He said his PCP ordered a chest x-ray which was unremarkable. He also said his blood pressure was normal at his PCPs office. He has a dry cough. He denies leg swelling.  ECG performed in the office today which I ordered and personally interpreted demonstrates normal sinus rhythm with no ischemic ST segment or T-wave abnormalities, nor any arrhythmias.   Soc Hx: Married x 20 yrs, divorced, now remarried > 30 years. Served a year in [place] in late 1980's.   Review of Systems: As per "subjective", otherwise negative.  No Known Allergies  Current Outpatient Prescriptions  Medication Sig Dispense Refill  . allopurinol (ZYLOPRIM) 300 MG tablet Take 1 tablet by mouth daily.    Marland Kitchen amLODipine (NORVASC) 10 MG tablet TAKE 1 TABLET BY MOUTH EVERY DAY 30 tablet 3  . aspirin 81 MG tablet Take 81 mg by mouth daily.    . chlorthalidone (HYGROTON) 25 MG tablet TAKE 1 TABLET BY MOUTH EVERY DAY 30 tablet 3  . cholecalciferol (VITAMIN D) 1000 UNITS tablet Take 2,000 Units by mouth daily.      . isosorbide mononitrate (IMDUR) 60 MG 24 hr tablet Take 60 mg by mouth daily.      . meloxicam (MOBIC) 7.5 MG tablet Take 1 tablet by mouth daily.    . metoprolol tartrate (LOPRESSOR) 50 MG tablet TAKE 1 TABLET BY MOUTH TWICE DAILY 60  tablet 6  . nitroGLYCERIN (NITROSTAT) 0.4 MG SL tablet Place 1 tablet (0.4 mg total) under the tongue every 5 (five) minutes as needed. 25 tablet 3  . pravastatin (PRAVACHOL) 40 MG tablet TAKE 1 TABLET BY MOUTH EVERY EVENING. 90 tablet 3  . terazosin (HYTRIN) 5 MG capsule Take 5 mg by mouth at bedtime.       No current facility-administered medications for this visit.     Past Medical History:  Diagnosis Date  . Coronary artery disease 11/22/2007   Status post PCI with drug-eluting stent 2009, September 2012 negative outpatient stress test  . Essential hypertension, benign    Poorly controlled  . GERD (gastroesophageal reflux disease)   . Postherpetic neuralgia   . Pure hypercholesterolemia     No past surgical history on file.  Social History   Social History  . Marital status: Married    Spouse name: N/A  . Number of children: N/A  . Years of education: N/A   Occupational History  . retired     work in Tourist information centre manager for 30 years   Social History Main Topics  . Smoking status: Former Smoker    Packs/day: 0.50    Years: 25.00    Types: Cigarettes    Start date: 09/26/1956    Quit date: 02/16/1978  . Smokeless tobacco: Never Used  . Alcohol use No  . Drug use: No  . Sexual  activity: Not on file   Other Topics Concern  . Not on file   Social History Narrative  . No narrative on file     Vitals:   11/10/16 0818  BP: (!) 160/80  Pulse: (!) 59  SpO2: 96%  Weight: 191 lb (86.6 kg)  Height: 5\' 10"  (1.778 m)    Wt Readings from Last 3 Encounters:  11/10/16 191 lb (86.6 kg)  11/07/15 196 lb (88.9 kg)  11/12/14 192 lb (87.1 kg)     PHYSICAL EXAM General: NAD HEENT: Normal. Neck: No JVD, no thyromegaly. Lungs: Clear to auscultation bilaterally with normal respiratory effort. CV: Nondisplaced PMI.  Regular rate and rhythm, normal S1/S2, no S3/S4, no murmur. No pretibial or periankle edema.  No carotid bruit.   Abdomen: Soft, nontender, no distention.    Neurologic: Alert and oriented.  Psych: Normal affect. Skin: Normal. Musculoskeletal: No gross deformities.    ECG: Most recent ECG reviewed.   Labs: Lab Results  Component Value Date/Time   K 3.4 (L) 11/22/2007 03:25 AM   BUN 15 11/22/2007 03:25 AM   CREATININE 1.09 11/22/2007 03:25 AM   HGB 15.8 11/22/2007 03:25 AM     Lipids: No results found for: LDLCALC, LDLDIRECT, CHOL, TRIG, HDL     ASSESSMENT AND PLAN:  1. CAD with progressive exertional dyspnea: Symptoms appear to be his anginal equivalent. I will order a Lexiscan Myoview stress test to evaluate for ischemia. No changes to current medical regimen, which includes ASA, metoprolol, Imdur, and pravastatin.   2. Essential HTN: Elevated today but normal at PCPs office. I will monitor.  3. Hyperlipidemia: On pravastatin.      Disposition: Follow up 3 weeks.   Kate Sable, M.D., F.A.C.C.

## 2016-11-10 NOTE — Patient Instructions (Signed)
Medication Instructions:  Continue all current medications.  Labwork: none  Testing/Procedures:  Your physician has requested that you have a lexiscan myoview. For further information please visit [email or web address]. Please follow instruction sheet, as given.  Office will contact with results via phone or letter.    Follow-Up: 3 weeks   Any Other Special Instructions Will Be Listed Below (If Applicable).  If you need a refill on your cardiac medications before your next appointment, please call your pharmacy.

## 2016-11-10 NOTE — Telephone Encounter (Signed)
Pre-cert Verification for the following procedure  Lexiscan scheduled for 11-12-16

## 2016-11-12 ENCOUNTER — Encounter (HOSPITAL_BASED_OUTPATIENT_CLINIC_OR_DEPARTMENT_OTHER)
Admission: RE | Admit: 2016-11-12 | Discharge: 2016-11-12 | Disposition: A | Discharge status: Home / Self Care | Payer: Medicare Other | Source: Ambulatory Visit | Attending: Cardiovascular Disease | Admitting: Cardiovascular Disease

## 2016-11-12 ENCOUNTER — Encounter (HOSPITAL_COMMUNITY): Payer: Self-pay

## 2016-11-12 ENCOUNTER — Encounter (HOSPITAL_COMMUNITY)
Admission: RE | Admit: 2016-11-12 | Discharge: 2016-11-12 | Disposition: A | Discharge status: Home / Self Care | Payer: Medicare Other | Source: Ambulatory Visit | Attending: Cardiovascular Disease | Admitting: Cardiovascular Disease

## 2016-11-12 DIAGNOSIS — R0609 Other forms of dyspnea: Secondary | ICD-10-CM | POA: Insufficient documentation

## 2016-11-12 LAB — NM MYOCAR MULTI W/SPECT W/WALL MOTION / EF
CHL CUP NUCLEAR SRS: 2
CHL CUP NUCLEAR SSS: 4
LV dias vol: 114 mL (ref 62–150)
LV sys vol: 46 mL
Peak HR: 76 {beats}/min
RATE: 0.33
Rest HR: 51 {beats}/min
SDS: 2
TID: 0.98

## 2016-11-12 MED ORDER — TECHNETIUM TC 99M TETROFOSMIN IV KIT
10.0000 | PACK | Freq: Once | INTRAVENOUS | Status: AC | PRN
  Administered 2016-11-12: 10.9 via INTRAVENOUS

## 2016-11-12 MED ORDER — REGADENOSON 0.4 MG/5ML IV SOLN
INTRAVENOUS | Status: AC
  Administered 2016-11-12: 0.4 mg via INTRAVENOUS
  Filled 2016-11-12: qty 5

## 2016-11-12 MED ORDER — SODIUM CHLORIDE 0.9% FLUSH
INTRAVENOUS | Status: AC
  Administered 2016-11-12: 10 mL via INTRAVENOUS
  Filled 2016-11-12: qty 10

## 2016-11-12 MED ORDER — TECHNETIUM TC 99M TETROFOSMIN IV KIT
30.0000 | PACK | Freq: Once | INTRAVENOUS | Status: AC | PRN
  Administered 2016-11-12: 32 via INTRAVENOUS

## 2016-11-19 DIAGNOSIS — M1009 Idiopathic gout, multiple sites: Secondary | ICD-10-CM | POA: Diagnosis not present

## 2016-11-19 DIAGNOSIS — M170 Bilateral primary osteoarthritis of knee: Secondary | ICD-10-CM | POA: Diagnosis not present

## 2016-11-19 DIAGNOSIS — Z79899 Other long term (current) drug therapy: Secondary | ICD-10-CM | POA: Diagnosis not present

## 2016-11-19 DIAGNOSIS — I10 Essential (primary) hypertension: Secondary | ICD-10-CM | POA: Diagnosis not present

## 2016-11-19 DIAGNOSIS — J440 Chronic obstructive pulmonary disease with acute lower respiratory infection: Secondary | ICD-10-CM | POA: Diagnosis not present

## 2016-12-03 ENCOUNTER — Ambulatory Visit (INDEPENDENT_AMBULATORY_CARE_PROVIDER_SITE_OTHER): Payer: Medicare Other | Admitting: Cardiovascular Disease

## 2016-12-03 ENCOUNTER — Encounter: Payer: Self-pay | Admitting: Cardiovascular Disease

## 2016-12-03 VITALS — BP 124/78 | HR 63 | Ht 70.5 in | Wt 191.0 lb

## 2016-12-03 DIAGNOSIS — I25118 Atherosclerotic heart disease of native coronary artery with other forms of angina pectoris: Secondary | ICD-10-CM

## 2016-12-03 DIAGNOSIS — E785 Hyperlipidemia, unspecified: Secondary | ICD-10-CM | POA: Diagnosis not present

## 2016-12-03 DIAGNOSIS — I10 Essential (primary) hypertension: Secondary | ICD-10-CM | POA: Diagnosis not present

## 2016-12-03 DIAGNOSIS — I209 Angina pectoris, unspecified: Secondary | ICD-10-CM

## 2016-12-03 DIAGNOSIS — R0609 Other forms of dyspnea: Secondary | ICD-10-CM | POA: Diagnosis not present

## 2016-12-03 NOTE — Patient Instructions (Addendum)
Medication Instructions:  Continue all current medications.  Labwork: none  Testing/Procedures: Spirometry   Follow-Up: 2 months   Any Other Special Instructions Will Be Listed Below (If Applicable).  If you need a refill on your cardiac medications before your next appointment, please call your pharmacy.

## 2016-12-03 NOTE — Progress Notes (Signed)
SUBJECTIVE: The patient returns for follow-up after undergoing cardiovascular testing performed for the evaluation of exertional dyspnea.  He has a history of coronary artery disease status post angioplasty and drug-eluting stent placement in 2009.  Nuclear stress test performed on 11/12/16 was normal.  For the past few months, he has been experiencing progressive exertional dyspnea with heavy exertion. He again mentions that it is similar to symptoms he experienced prior to stent placement 2009. He very seldom wheezes and occasionally has a dry cough.  He is very reassured by the normal stress test.    Soc Hx: Married x 20 yrs, divorced, now remarried >30 years. Served a year in [place] in late 1980's.   Review of Systems: As per "subjective", otherwise negative.  No Known Allergies  Current Outpatient Prescriptions  Medication Sig Dispense Refill  . allopurinol (ZYLOPRIM) 300 MG tablet Take 1 tablet by mouth daily.    Marland Kitchen amLODipine (NORVASC) 10 MG tablet TAKE 1 TABLET BY MOUTH EVERY DAY 30 tablet 3  . aspirin 81 MG tablet Take 81 mg by mouth daily.    . chlorthalidone (HYGROTON) 25 MG tablet TAKE 1 TABLET BY MOUTH EVERY DAY 30 tablet 3  . cholecalciferol (VITAMIN D) 1000 UNITS tablet Take 2,000 Units by mouth daily.      . isosorbide mononitrate (IMDUR) 60 MG 24 hr tablet Take 60 mg by mouth daily.      . meloxicam (MOBIC) 7.5 MG tablet Take 1 tablet by mouth daily.    . metoprolol tartrate (LOPRESSOR) 50 MG tablet TAKE 1 TABLET BY MOUTH TWICE DAILY 60 tablet 6  . nitroGLYCERIN (NITROSTAT) 0.4 MG SL tablet Place 1 tablet (0.4 mg total) under the tongue every 5 (five) minutes as needed. 25 tablet 3  . pravastatin (PRAVACHOL) 40 MG tablet TAKE 1 TABLET BY MOUTH EVERY EVENING. 90 tablet 3  . terazosin (HYTRIN) 5 MG capsule Take 5 mg by mouth at bedtime.       No current facility-administered medications for this visit.     Past Medical History:  Diagnosis Date  . Coronary  artery disease 11/22/2007   Status post PCI with drug-eluting stent 2009, September 2012 negative outpatient stress test  . Essential hypertension, benign    Poorly controlled  . GERD (gastroesophageal reflux disease)   . Postherpetic neuralgia   . Pure hypercholesterolemia     No past surgical history on file.  Social History   Social History  . Marital status: Married    Spouse name: N/A  . Number of children: N/A  . Years of education: N/A   Occupational History  . retired     work in Tourist information centre manager for 30 years   Social History Main Topics  . Smoking status: Former Smoker    Packs/day: 0.50    Years: 25.00    Types: Cigarettes    Start date: 09/26/1956    Quit date: 02/16/1978  . Smokeless tobacco: Never Used  . Alcohol use No  . Drug use: No  . Sexual activity: Not on file   Other Topics Concern  . Not on file   Social History Narrative  . No narrative on file     Vitals:   12/03/16 0813  BP: 124/78  Pulse: 63  SpO2: 97%  Weight: 191 lb (86.6 kg)  Height: 5' 10.5" (1.791 m)    Wt Readings from Last 3 Encounters:  12/03/16 191 lb (86.6 kg)  11/10/16 191 lb (86.6 kg)  11/07/15  196 lb (88.9 kg)     PHYSICAL EXAM General: NAD HEENT: Normal. Neck: No JVD, no thyromegaly. Lungs: Clear to auscultation bilaterally with normal respiratory effort. CV: Regular rate and rhythm, normal S1/S2, no S3/S4, no murmur. No pretibial or periankle edema.  No carotid bruit.   Abdomen: Soft, nontender, no distention.  Neurologic: Alert and oriented.  Psych: Normal affect. Skin: Normal. Musculoskeletal: No gross deformities.    ECG: Most recent ECG reviewed.   Labs: Lab Results  Component Value Date/Time   K 3.4 (L) 11/22/2007 03:25 AM   BUN 15 11/22/2007 03:25 AM   CREATININE 1.09 11/22/2007 03:25 AM   HGB 15.8 11/22/2007 03:25 AM     Lipids: No results found for: LDLCALC, LDLDIRECT, CHOL, TRIG, HDL     ASSESSMENT AND PLAN:  1. CAD with progressive  exertional dyspnea: Nuclear stress test was normal as noted above. No changes to current medical regimen, which includes ASA, metoprolol, Imdur, and pravastatin.  Given his time spent in [place] during the [employer], I will obtain spirometry. If this is normal, I may consider increasing the dose of Imdur.  2. Essential HTN: Controlled. No changes to therapy.  3. Hyperlipidemia: On pravastatin.       Disposition: Follow up 2 months.   Kate Sable, M.D., F.A.C.C.

## 2016-12-16 ENCOUNTER — Ambulatory Visit (HOSPITAL_COMMUNITY)
Admission: RE | Admit: 2016-12-16 | Discharge: 2016-12-16 | Disposition: A | Discharge status: Home / Self Care | Payer: Medicare Other | Source: Ambulatory Visit | Attending: Cardiovascular Disease | Admitting: Cardiovascular Disease

## 2016-12-16 DIAGNOSIS — J449 Chronic obstructive pulmonary disease, unspecified: Secondary | ICD-10-CM | POA: Insufficient documentation

## 2016-12-16 DIAGNOSIS — R0609 Other forms of dyspnea: Secondary | ICD-10-CM | POA: Insufficient documentation

## 2016-12-16 LAB — SPIROMETRY WITH GRAPH
FEF 25-75 POST: 0.93 L/s
FEF 25-75 Pre: 0.85 L/sec
FEF2575-%CHANGE-POST: 8 %
FEF2575-%PRED-PRE: 44 %
FEF2575-%Pred-Post: 48 %
FEV1-%Change-Post: 5 %
FEV1-%PRED-POST: 58 %
FEV1-%Pred-Pre: 55 %
FEV1-Post: 1.64 L
FEV1-Pre: 1.56 L
FEV1FVC-%CHANGE-POST: -1 %
FEV1FVC-%Pred-Pre: 88 %
FEV6-%Change-Post: 6 %
FEV6-%PRED-PRE: 65 %
FEV6-%Pred-Post: 70 %
FEV6-POST: 2.61 L
FEV6-PRE: 2.44 L
FEV6FVC-%Change-Post: 0 %
FEV6FVC-%Pred-Post: 105 %
FEV6FVC-%Pred-Pre: 106 %
FVC-%Change-Post: 7 %
FVC-%PRED-PRE: 62 %
FVC-%Pred-Post: 66 %
FVC-POST: 2.66 L
FVC-PRE: 2.48 L
Post FEV1/FVC ratio: 62 %
Post FEV6/FVC ratio: 98 %
Pre FEV1/FVC ratio: 63 %
Pre FEV6/FVC Ratio: 99 %

## 2016-12-16 MED ORDER — ALBUTEROL SULFATE (2.5 MG/3ML) 0.083% IN NEBU
2.5000 mg | INHALATION_SOLUTION | Freq: Once | RESPIRATORY_TRACT | Status: AC
Start: 2016-12-16 — End: 2016-12-16
  Administered 2016-12-16: 2.5 mg via RESPIRATORY_TRACT

## 2016-12-17 ENCOUNTER — Telehealth: Payer: Self-pay

## 2016-12-17 DIAGNOSIS — R942 Abnormal results of pulmonary function studies: Secondary | ICD-10-CM

## 2016-12-17 NOTE — Telephone Encounter (Signed)
Pt returned call--can leave message on answering machine if he doesn't answer

## 2016-12-17 NOTE — Telephone Encounter (Signed)
-----   Message from Herminio Commons, MD sent at 12/17/2016 12:46 PM EDT ----- Please make pulmonary referral. He has some ventilation defects.

## 2016-12-17 NOTE — Telephone Encounter (Signed)
Patient notified, ref placed, pcp copied

## 2016-12-17 NOTE — Telephone Encounter (Signed)
Referral placed to pulmonary, lmtcb-cc

## 2017-01-21 ENCOUNTER — Other Ambulatory Visit: Payer: Self-pay | Admitting: Cardiovascular Disease

## 2017-02-04 ENCOUNTER — Other Ambulatory Visit: Payer: Self-pay

## 2017-02-04 ENCOUNTER — Ambulatory Visit (INDEPENDENT_AMBULATORY_CARE_PROVIDER_SITE_OTHER): Payer: Medicare Other | Admitting: Cardiovascular Disease

## 2017-02-04 ENCOUNTER — Encounter: Payer: Self-pay | Admitting: Cardiovascular Disease

## 2017-02-04 VITALS — BP 150/70 | HR 54 | Ht 70.5 in | Wt 199.0 lb

## 2017-02-04 DIAGNOSIS — I25118 Atherosclerotic heart disease of native coronary artery with other forms of angina pectoris: Secondary | ICD-10-CM | POA: Diagnosis not present

## 2017-02-04 DIAGNOSIS — E785 Hyperlipidemia, unspecified: Secondary | ICD-10-CM

## 2017-02-04 DIAGNOSIS — I209 Angina pectoris, unspecified: Secondary | ICD-10-CM | POA: Diagnosis not present

## 2017-02-04 DIAGNOSIS — R0609 Other forms of dyspnea: Secondary | ICD-10-CM

## 2017-02-04 DIAGNOSIS — R942 Abnormal results of pulmonary function studies: Secondary | ICD-10-CM | POA: Diagnosis not present

## 2017-02-04 DIAGNOSIS — I10 Essential (primary) hypertension: Secondary | ICD-10-CM

## 2017-02-04 NOTE — Patient Instructions (Signed)

## 2017-02-04 NOTE — Progress Notes (Signed)
SUBJECTIVE: The patient presents for routine follow-up.  He has a history of coronary artery disease status post angioplasty and drug-eluting stent placement in 2009.  Nuclear stress test performed on 11/12/16 was normal.  He had been complaining of progressive exertional dyspnea.  Given his time spent [place] during [place], I obtain spirometry. It demonstrated a moderate ventilatory defect with obstruction.  He denies chest pain.  He said he has tried Advair in the past but it did not help.  When he is working he gets some shortness of breath and has to stop and get some rest and then continues working.  He has occasional wheezes.  He denies allergies.  He is bothered by perfumes and certain detergents.   Soc Hx: Married x 20 yrs, divorced, now remarried >30 years. Served a year in [place] in late 1980's.      Review of Systems: As per "subjective", otherwise negative.  No Known Allergies  Current Outpatient Medications  Medication Sig Dispense Refill  . allopurinol (ZYLOPRIM) 300 MG tablet Take 1 tablet by mouth daily.    Marland Kitchen amLODipine (NORVASC) 10 MG tablet TAKE 1 TABLET BY MOUTH EVERY DAY 90 tablet 3  . aspirin 81 MG tablet Take 81 mg by mouth daily.    . chlorthalidone (HYGROTON) 25 MG tablet TAKE 1 TABLET BY MOUTH EVERY DAY 90 tablet 3  . cholecalciferol (VITAMIN D) 1000 UNITS tablet Take 2,000 Units by mouth daily.      . isosorbide mononitrate (IMDUR) 60 MG 24 hr tablet Take 60 mg by mouth daily.      . meloxicam (MOBIC) 7.5 MG tablet Take 1 tablet by mouth daily.    . metoprolol tartrate (LOPRESSOR) 50 MG tablet TAKE 1 TABLET BY MOUTH TWICE DAILY 60 tablet 6  . nitroGLYCERIN (NITROSTAT) 0.4 MG SL tablet Place 1 tablet (0.4 mg total) under the tongue every 5 (five) minutes as needed. 25 tablet 3  . pravastatin (PRAVACHOL) 40 MG tablet TAKE 1 TABLET BY MOUTH EVERY EVENING 90 tablet 3  . terazosin (HYTRIN) 5 MG capsule Take 5 mg by mouth at bedtime.        No current facility-administered medications for this visit.     Past Medical History:  Diagnosis Date  . Coronary artery disease 11/22/2007   Status post PCI with drug-eluting stent 2009, September 2012 negative outpatient stress test  . Essential hypertension, benign    Poorly controlled  . GERD (gastroesophageal reflux disease)   . Postherpetic neuralgia   . Pure hypercholesterolemia     No past surgical history on file.  Social History   Socioeconomic History  . Marital status: Married    Spouse name: Not on file  . Number of children: Not on file  . Years of education: Not on file  . Highest education level: Not on file  Social Needs  . Financial resource strain: Not on file  . Food insecurity - worry: Not on file  . Food insecurity - inability: Not on file  . Transportation needs - medical: Not on file  . Transportation needs - non-medical: Not on file  Occupational History  . 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 Smoking status: Former Smoker    Packs/day: 0.50    Years: 25.00    Pack years: 12.50    Types: Cigarettes    Start date: 09/26/1956    Last attempt to quit: 02/16/1978  Years since quitting: 38.9  . Smokeless tobacco: Never Used  Substance and Sexual Activity  . Alcohol use: No    Alcohol/week: 0.0 oz  . Drug use: No  . Sexual activity: Not on file  Other Topics Concern  . Not on file  Social History Narrative  . Not on file     Vitals:   02/04/17 0853  BP: (!) 150/70  Pulse: (!) 54  SpO2: 96%  Weight: 199 lb (90.3 kg)  Height: 5' 10.5" (1.791 m)    Wt Readings from Last 3 Encounters:  02/04/17 199 lb (90.3 kg)  12/03/16 191 lb (86.6 kg)  11/10/16 191 lb (86.6 kg)     PHYSICAL EXAM General: NAD HEENT: Normal. Neck: No JVD, no thyromegaly. Lungs: Clear to auscultation bilaterally with normal respiratory effort. CV: Regular rate and rhythm, normal S1/S2, no S3/S4, no murmur. No  pretibial or periankle edema.  Abdomen: Soft, nontender, no distention.  Neurologic: Alert and oriented.  Psych: Normal affect. Skin: Normal. Musculoskeletal: No gross deformities.    ECG: Most recent ECG reviewed.   Labs: Lab Results  Component Value Date/Time   K 3.4 (L) 11/22/2007 03:25 AM   BUN 15 11/22/2007 03:25 AM   CREATININE 1.09 11/22/2007 03:25 AM   HGB 15.8 11/22/2007 03:25 AM     Lipids: No results found for: LDLCALC, LDLDIRECT, CHOL, TRIG, HDL     ASSESSMENT AND PLAN:  1. CAD: Symptomatically stable overall. Nuclear stress test was normal as noted above. No changes to current medical regimen, which includes ASA, metoprolol, Imdur, and pravastatin.  If he continues to have symptoms of shortness of breath at his next visit, I would consider increasing the dose of Imdur.  2. Essential GOT:LXBWIO elevated.  Controlled at last visit.  I will monitor.  3. Hyperlipidemia: On pravastatin.   4.  Progressive exertional dyspnea: Spirometry results detailed above with a moderate ventilatory defect and obstruction.  I have already made a referral to pulmonary.  He is scheduled to see them on January 19.   Disposition: Follow up 6 months   Kate Sable, M.D., F.A.C.C.

## 2017-02-23 DIAGNOSIS — M1009 Idiopathic gout, multiple sites: Secondary | ICD-10-CM | POA: Diagnosis not present

## 2017-02-23 DIAGNOSIS — M170 Bilateral primary osteoarthritis of knee: Secondary | ICD-10-CM | POA: Diagnosis not present

## 2017-02-23 DIAGNOSIS — I10 Essential (primary) hypertension: Secondary | ICD-10-CM | POA: Diagnosis not present

## 2017-02-23 DIAGNOSIS — J441 Chronic obstructive pulmonary disease with (acute) exacerbation: Secondary | ICD-10-CM | POA: Diagnosis not present

## 2017-03-03 DIAGNOSIS — I10 Essential (primary) hypertension: Secondary | ICD-10-CM | POA: Diagnosis not present

## 2017-03-03 DIAGNOSIS — J45909 Unspecified asthma, uncomplicated: Secondary | ICD-10-CM | POA: Diagnosis not present

## 2017-03-03 DIAGNOSIS — J449 Chronic obstructive pulmonary disease, unspecified: Secondary | ICD-10-CM | POA: Diagnosis not present

## 2017-03-03 DIAGNOSIS — R0602 Shortness of breath: Secondary | ICD-10-CM | POA: Diagnosis not present

## 2017-03-05 ENCOUNTER — Other Ambulatory Visit (HOSPITAL_COMMUNITY): Payer: Self-pay | Admitting: Pulmonary Disease

## 2017-03-05 ENCOUNTER — Other Ambulatory Visit (HOSPITAL_COMMUNITY): Payer: Self-pay | Admitting: Respiratory Therapy

## 2017-03-05 DIAGNOSIS — R0602 Shortness of breath: Secondary | ICD-10-CM

## 2017-03-05 DIAGNOSIS — Z77090 Contact with and (suspected) exposure to asbestos: Secondary | ICD-10-CM

## 2017-03-10 ENCOUNTER — Ambulatory Visit (HOSPITAL_COMMUNITY)
Admission: RE | Admit: 2017-03-10 | Discharge: 2017-03-10 | Disposition: A | Discharge status: Home / Self Care | Payer: Medicare Other | Source: Ambulatory Visit | Attending: Pulmonary Disease | Admitting: Pulmonary Disease

## 2017-03-10 DIAGNOSIS — I2510 Atherosclerotic heart disease of native coronary artery without angina pectoris: Secondary | ICD-10-CM | POA: Insufficient documentation

## 2017-03-10 DIAGNOSIS — R0602 Shortness of breath: Secondary | ICD-10-CM | POA: Diagnosis not present

## 2017-03-10 DIAGNOSIS — Z77090 Contact with and (suspected) exposure to asbestos: Secondary | ICD-10-CM | POA: Diagnosis not present

## 2017-03-10 DIAGNOSIS — I700 Atherosclerosis of aorta: Secondary | ICD-10-CM | POA: Diagnosis not present

## 2017-03-10 DIAGNOSIS — Z9049 Acquired absence of other specified parts of digestive tract: Secondary | ICD-10-CM | POA: Diagnosis not present

## 2017-03-18 ENCOUNTER — Ambulatory Visit (HOSPITAL_COMMUNITY)
Admission: RE | Admit: 2017-03-18 | Discharge: 2017-03-18 | Disposition: A | Discharge status: Home / Self Care | Payer: Medicare Other | Source: Ambulatory Visit | Attending: Pulmonary Disease | Admitting: Pulmonary Disease

## 2017-03-18 DIAGNOSIS — R0602 Shortness of breath: Secondary | ICD-10-CM | POA: Diagnosis not present

## 2017-03-18 LAB — PULMONARY FUNCTION TEST
DL/VA % PRED: 88 %
DL/VA: 4.03 ml/min/mmHg/L
DLCO UNC: 20.05 ml/min/mmHg
DLCO unc % pred: 62 %
FEF 25-75 POST: 1.1 L/s
FEF 25-75 Pre: 0.7 L/sec
FEF2575-%Change-Post: 57 %
FEF2575-%PRED-PRE: 36 %
FEF2575-%Pred-Post: 58 %
FEV1-%Change-Post: 8 %
FEV1-%PRED-POST: 69 %
FEV1-%Pred-Pre: 63 %
FEV1-Post: 1.94 L
FEV1-Pre: 1.79 L
FEV1FVC-%Change-Post: -4 %
FEV1FVC-%PRED-PRE: 83 %
FEV6-%Change-Post: 13 %
FEV6-%PRED-POST: 84 %
FEV6-%Pred-Pre: 74 %
FEV6-POST: 3.12 L
FEV6-Pre: 2.74 L
FEV6FVC-%CHANGE-POST: 0 %
FEV6FVC-%PRED-POST: 97 %
FEV6FVC-%Pred-Pre: 97 %
FVC-%Change-Post: 13 %
FVC-%Pred-Post: 86 %
FVC-%Pred-Pre: 76 %
FVC-Post: 3.45 L
FVC-Pre: 3.03 L
POST FEV6/FVC RATIO: 91 %
PRE FEV1/FVC RATIO: 59 %
Post FEV1/FVC ratio: 56 %
Pre FEV6/FVC Ratio: 91 %
RV % pred: 171 %
RV: 4.62 L
TLC % PRED: 109 %
TLC: 7.76 L

## 2017-03-18 MED ORDER — ALBUTEROL SULFATE (2.5 MG/3ML) 0.083% IN NEBU
2.5000 mg | INHALATION_SOLUTION | Freq: Once | RESPIRATORY_TRACT | Status: AC
  Administered 2017-03-18: 2.5 mg via RESPIRATORY_TRACT

## 2017-03-31 DIAGNOSIS — L570 Actinic keratosis: Secondary | ICD-10-CM | POA: Diagnosis not present

## 2017-03-31 DIAGNOSIS — L821 Other seborrheic keratosis: Secondary | ICD-10-CM | POA: Diagnosis not present

## 2017-03-31 DIAGNOSIS — B353 Tinea pedis: Secondary | ICD-10-CM | POA: Diagnosis not present

## 2017-04-05 DIAGNOSIS — Z6827 Body mass index (BMI) 27.0-27.9, adult: Secondary | ICD-10-CM | POA: Diagnosis not present

## 2017-04-05 DIAGNOSIS — M1712 Unilateral primary osteoarthritis, left knee: Secondary | ICD-10-CM | POA: Diagnosis not present

## 2017-04-05 DIAGNOSIS — M7541 Impingement syndrome of right shoulder: Secondary | ICD-10-CM | POA: Diagnosis not present

## 2017-05-26 DIAGNOSIS — M1009 Idiopathic gout, multiple sites: Secondary | ICD-10-CM | POA: Diagnosis not present

## 2017-05-26 DIAGNOSIS — J441 Chronic obstructive pulmonary disease with (acute) exacerbation: Secondary | ICD-10-CM | POA: Diagnosis not present

## 2017-05-26 DIAGNOSIS — M170 Bilateral primary osteoarthritis of knee: Secondary | ICD-10-CM | POA: Diagnosis not present

## 2017-05-26 DIAGNOSIS — I10 Essential (primary) hypertension: Secondary | ICD-10-CM | POA: Diagnosis not present

## 2017-05-31 ENCOUNTER — Other Ambulatory Visit: Payer: Self-pay

## 2017-05-31 MED ORDER — METOPROLOL TARTRATE 50 MG PO TABS: 50.0000 mg | ORAL_TABLET | Freq: Two times a day (BID) | ORAL | 3 refills | Status: DC

## 2017-08-10 ENCOUNTER — Ambulatory Visit (INDEPENDENT_AMBULATORY_CARE_PROVIDER_SITE_OTHER): Payer: Medicare Other | Admitting: Cardiovascular Disease

## 2017-08-10 ENCOUNTER — Encounter: Payer: Self-pay | Admitting: Cardiovascular Disease

## 2017-08-10 VITALS — BP 156/60 | HR 70 | Ht 70.5 in | Wt 193.0 lb

## 2017-08-10 DIAGNOSIS — R942 Abnormal results of pulmonary function studies: Secondary | ICD-10-CM

## 2017-08-10 DIAGNOSIS — E785 Hyperlipidemia, unspecified: Secondary | ICD-10-CM

## 2017-08-10 DIAGNOSIS — I10 Essential (primary) hypertension: Secondary | ICD-10-CM | POA: Diagnosis not present

## 2017-08-10 DIAGNOSIS — I25118 Atherosclerotic heart disease of native coronary artery with other forms of angina pectoris: Secondary | ICD-10-CM

## 2017-08-10 DIAGNOSIS — R0609 Other forms of dyspnea: Secondary | ICD-10-CM

## 2017-08-10 NOTE — Progress Notes (Signed)
SUBJECTIVE: The patient presents for routine follow-up.  He has a history of coronary artery disease and underwent drug-eluting stent placement in 2009.  Nuclear stress test on 11/12/2016 was normal.  He has a moderate ventilatory defect with obstruction seen by spirometry which I ordered for progressive exertional dyspnea.  He is doing well and denies chest pain.  Chronic exertional dyspnea is stable.  He does not experience it when walking on level ground but may experience it when climbing halfway up a ladder.  He continues to stay very active and mows 4 lawns per week.  He is bothered by left knee and right shoulder arthritis and receives injections into his left knee and uses a topical ointment for his right shoulder.  He was evaluated by pulmonary but I do not have a copy of their office note.    Soc Hx: Married x 20 yrs, divorced, now remarried >30 years. Served a year in [place] in late 1980's.  Review of Systems: As per "subjective", otherwise negative.  No Known Allergies  Current Outpatient Medications  Medication Sig Dispense Refill  . allopurinol (ZYLOPRIM) 300 MG tablet Take 1 tablet by mouth daily.    Marland Kitchen amLODipine (NORVASC) 10 MG tablet TAKE 1 TABLET BY MOUTH EVERY DAY 90 tablet 3  . aspirin 81 MG tablet Take 81 mg by mouth daily.    . chlorthalidone (HYGROTON) 25 MG tablet TAKE 1 TABLET BY MOUTH EVERY DAY 90 tablet 3  . cholecalciferol (VITAMIN D) 1000 UNITS tablet Take 2,000 Units by mouth daily.      . isosorbide mononitrate (IMDUR) 60 MG 24 hr tablet Take 60 mg by mouth daily.      . meloxicam (MOBIC) 7.5 MG tablet Take 1 tablet by mouth daily.    . metoprolol tartrate (LOPRESSOR) 50 MG tablet Take 1 tablet (50 mg total) by mouth 2 (two) times daily. 60 tablet 3  . nitroGLYCERIN (NITROSTAT) 0.4 MG SL tablet Place 1 tablet (0.4 mg total) under the tongue every 5 (five) minutes as needed. 25 tablet 3  . pravastatin (PRAVACHOL) 40 MG tablet TAKE 1 TABLET BY MOUTH  EVERY EVENING 90 tablet 3  . terazosin (HYTRIN) 5 MG capsule Take 5 mg by mouth at bedtime.       No current facility-administered medications for this visit.     Past Medical History:  Diagnosis Date  . Coronary artery disease 11/22/2007   Status post PCI with drug-eluting stent 2009, September 2012 negative outpatient stress test  . Essential hypertension, benign    Poorly controlled  . GERD (gastroesophageal reflux disease)   . Postherpetic neuralgia   . Pure hypercholesterolemia     No past surgical history on file.  Social History   Socioeconomic History  . Marital status: Married    Spouse name: Not on file  . Number of children: Not on file  . Years of education: Not on file  . Highest education level: Not on file  Occupational History  . 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 Financial resource strain: Not on file  . Food insecurity:    Worry: Not on file    Inability: Not on file  . Transportation needs:    Medical: Not on file    Non-medical: Not on file  Tobacco Use  . Smoking status: Former Smoker    Packs/day: 0.50    Years: 25.00    Pack years:  12.50    Types: Cigarettes    Start date: 09/26/1956    Last attempt to quit: 02/16/1978    Years since quitting: 39.5  . Smokeless tobacco: Never Used  Substance and Sexual Activity  . Alcohol use: No    Alcohol/week: 0.0 oz  . Drug use: No  . Sexual activity: Not on file  Lifestyle  . Physical activity:    Days per week: Not on file    Minutes per session: Not on file  . Stress: Not on file  Relationships  . Social connections:    Talks on phone: Not on file    Gets together: Not on file    Attends religious service: Not on file    Active member of club or organization: Not on file    Attends meetings of clubs or organizations: Not on file    Relationship status: Not on file  . Intimate partner violence:    Fear of current or ex partner: Not on file     Emotionally abused: Not on file    Physically abused: Not on file    Forced sexual activity: Not on file  Other Topics Concern  . Not on file  Social History Narrative  . Not on file     Vitals:   08/10/17 1534  BP: (!) 156/60  Pulse: 70  SpO2: 96%  Weight: 193 lb (87.5 kg)  Height: 5' 10.5" (1.791 m)    Wt Readings from Last 3 Encounters:  08/10/17 193 lb (87.5 kg)  02/04/17 199 lb (90.3 kg)  12/03/16 191 lb (86.6 kg)     PHYSICAL EXAM General: NAD HEENT: Normal. Neck: No JVD, no thyromegaly. Lungs: Clear to auscultation bilaterally with normal respiratory effort. CV: Regular rate and rhythm, normal S1/S2, no S3/S4, no murmur. No pretibial or periankle edema.  No carotid bruit.   Abdomen: Soft, nontender, no distention.  Neurologic: Alert and oriented.  Psych: Normal affect. Skin: Normal. Musculoskeletal: No gross deformities.    ECG: Most recent ECG reviewed.   Labs: Lab Results  Component Value Date/Time   K 3.4 (L) 11/22/2007 03:25 AM   BUN 15 11/22/2007 03:25 AM   CREATININE 1.09 11/22/2007 03:25 AM   HGB 15.8 11/22/2007 03:25 AM     Lipids: No results found for: LDLCALC, LDLDIRECT, CHOL, TRIG, HDL     ASSESSMENT AND PLAN:  1.  Coronary artery disease:  Symptomatically stable on aspirin, metoprolol, Imdur, and pravastatin.  Most recent nuclear stress test detailed above.  No changes to therapy.  2. Essential ZHY:QMVHQ pressure remains elevated.  I have asked him to check his blood pressure 3 times per week for the next month so that I can determine if further antihypertensive titration is indicated.  3. Hyperlipidemia: On pravastatin. I will obtain a copy of lipids from PCP.  4.  Progressive exertional dyspnea: Spirometry results detailed above with a moderate ventilatory defect and obstruction.  He was evaluated by pulmonary.  I will try to obtain a copy of their office note evaluation.     Disposition: Follow up 6 months   Kate Sable, M.D., F.A.C.C.

## 2017-08-10 NOTE — Patient Instructions (Addendum)
Medication Instructions:  Continue all current medications.  Labwork: none  Testing/Procedures: none  Follow-Up: Your physician wants you to follow up in: 6 months.  You will receive a reminder letter in the mail one-two months in advance.  If you don't receive a letter, please call our office to schedule the follow up appointment   Any Other Special Instructions Will Be Listed Below (If Applicable). Your physician has requested that you regularly monitor and record your blood pressure readings at home. Please log readings 3 x week for 1 month and return log to office for MD review.    If you need a refill on your cardiac medications before your next appointment, please call your pharmacy.

## 2017-08-12 ENCOUNTER — Encounter: Payer: Self-pay | Admitting: *Deleted

## 2017-09-02 DIAGNOSIS — J441 Chronic obstructive pulmonary disease with (acute) exacerbation: Secondary | ICD-10-CM | POA: Diagnosis not present

## 2017-09-02 DIAGNOSIS — M1009 Idiopathic gout, multiple sites: Secondary | ICD-10-CM | POA: Diagnosis not present

## 2017-09-02 DIAGNOSIS — M170 Bilateral primary osteoarthritis of knee: Secondary | ICD-10-CM | POA: Diagnosis not present

## 2017-09-02 DIAGNOSIS — I10 Essential (primary) hypertension: Secondary | ICD-10-CM | POA: Diagnosis not present

## 2017-09-18 ENCOUNTER — Other Ambulatory Visit: Payer: Self-pay | Admitting: Cardiovascular Disease

## 2017-09-21 ENCOUNTER — Telehealth: Payer: Self-pay | Admitting: *Deleted

## 2017-09-21 NOTE — Telephone Encounter (Signed)
Per Dr. Bronson Ing review of BP log - patient needs to begin Lisinopril 5mg  daily.   Attempted to notify patient - lmtcb.

## 2017-09-22 MED ORDER — LISINOPRIL 5 MG PO TABS: 5.0000 mg | ORAL_TABLET | Freq: Every day | ORAL | 1 refills | Status: DC

## 2017-09-22 NOTE — Telephone Encounter (Signed)
Pt aware - lisinopril send to [facility] as requested

## 2017-09-22 NOTE — Telephone Encounter (Signed)
Patient returned call.  He requested to call his cell [phone].

## 2017-10-04 DIAGNOSIS — M1712 Unilateral primary osteoarthritis, left knee: Secondary | ICD-10-CM | POA: Diagnosis not present

## 2017-10-04 DIAGNOSIS — Z6827 Body mass index (BMI) 27.0-27.9, adult: Secondary | ICD-10-CM | POA: Diagnosis not present

## 2017-11-17 ENCOUNTER — Other Ambulatory Visit: Payer: Self-pay | Admitting: Cardiovascular Disease

## 2017-12-16 DIAGNOSIS — I10 Essential (primary) hypertension: Secondary | ICD-10-CM | POA: Diagnosis not present

## 2017-12-16 DIAGNOSIS — M170 Bilateral primary osteoarthritis of knee: Secondary | ICD-10-CM | POA: Diagnosis not present

## 2017-12-16 DIAGNOSIS — Z1389 Encounter for screening for other disorder: Secondary | ICD-10-CM | POA: Diagnosis not present

## 2017-12-16 DIAGNOSIS — J449 Chronic obstructive pulmonary disease, unspecified: Secondary | ICD-10-CM | POA: Diagnosis not present

## 2017-12-16 DIAGNOSIS — Z0000 Encounter for general adult medical examination without abnormal findings: Secondary | ICD-10-CM | POA: Diagnosis not present

## 2017-12-16 DIAGNOSIS — M1009 Idiopathic gout, multiple sites: Secondary | ICD-10-CM | POA: Diagnosis not present

## 2017-12-23 DIAGNOSIS — I517 Cardiomegaly: Secondary | ICD-10-CM | POA: Diagnosis not present

## 2017-12-23 DIAGNOSIS — R0602 Shortness of breath: Secondary | ICD-10-CM | POA: Diagnosis not present

## 2018-01-20 DIAGNOSIS — M810 Age-related osteoporosis without current pathological fracture: Secondary | ICD-10-CM | POA: Diagnosis not present

## 2018-01-20 DIAGNOSIS — Z13820 Encounter for screening for osteoporosis: Secondary | ICD-10-CM | POA: Diagnosis not present

## 2018-02-15 ENCOUNTER — Encounter: Payer: Self-pay | Admitting: Cardiovascular Disease

## 2018-02-15 ENCOUNTER — Ambulatory Visit (INDEPENDENT_AMBULATORY_CARE_PROVIDER_SITE_OTHER): Payer: Medicare Other | Admitting: Cardiovascular Disease

## 2018-02-15 VITALS — BP 154/72 | HR 68 | Ht 70.0 in | Wt 189.0 lb

## 2018-02-15 DIAGNOSIS — I10 Essential (primary) hypertension: Secondary | ICD-10-CM

## 2018-02-15 DIAGNOSIS — E785 Hyperlipidemia, unspecified: Secondary | ICD-10-CM

## 2018-02-15 DIAGNOSIS — I25118 Atherosclerotic heart disease of native coronary artery with other forms of angina pectoris: Secondary | ICD-10-CM

## 2018-02-15 DIAGNOSIS — R0609 Other forms of dyspnea: Secondary | ICD-10-CM

## 2018-02-15 MED ORDER — LISINOPRIL 10 MG PO TABS: 10.0000 mg | ORAL_TABLET | Freq: Every day | ORAL | 3 refills | Status: DC

## 2018-02-15 NOTE — Progress Notes (Signed)
SUBJECTIVE: The patient presents for routine follow-up.  He has a history of coronary artery disease and underwent drug-eluting stent placement in 2009.  Nuclear stress test on 11/12/2016 was normal.  He has a moderate ventilatory defect with obstruction seen by spirometry which I ordered for progressive exertional dyspnea.  The patient denies any symptoms of chest pain, palpitations, shortness of breath, lightheadedness, dizziness, leg swelling, orthopnea, PND, and syncope.  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   Soc Hx: Married x 20 yrs, divorced, now remarried >30 years. Served a year in [place] in late 1980's.  Review of Systems: As per "subjective", otherwise negative.  No Known Allergies  Current Outpatient Medications  Medication Sig Dispense Refill  . allopurinol (ZYLOPRIM) 300 MG tablet Take 1 tablet by mouth daily.    Marland Kitchen amLODipine (NORVASC) 10 MG tablet TAKE ONE TABLET BY MOUTH DAILY 90 tablet 3  . aspirin 81 MG tablet Take 81 mg by mouth daily.    . chlorthalidone (HYGROTON) 25 MG tablet TAKE ONE TABLET BY MOUTH DAILY 90 tablet 3  . cholecalciferol (VITAMIN D) 1000 UNITS tablet Take 2,000 Units by mouth daily.      . isosorbide mononitrate (IMDUR) 60 MG 24 hr tablet Take 60 mg by mouth daily.      . meloxicam (MOBIC) 7.5 MG tablet Take 1 tablet by mouth daily.    . metoprolol tartrate (LOPRESSOR) 50 MG tablet TAKE 1 TABLET BY MOUTH TWICE DAILY 180 tablet 1  . nitroGLYCERIN (NITROSTAT) 0.4 MG SL tablet Place 1 tablet (0.4 mg total) under the tongue every 5 (five) minutes as needed. 25 tablet 3  . pravastatin (PRAVACHOL) 40 MG tablet TAKE 1 TABLET BY MOUTH EVERY EVENING 90 tablet 3  . terazosin (HYTRIN) 5 MG capsule Take 5 mg by mouth at bedtime.      Marland Kitchen lisinopril (PRINIVIL,ZESTRIL) 5 MG tablet Take 1 tablet (5 mg total) by mouth daily. 90 tablet 1   No current facility-administered medications for this visit.      Past Medical History:  Diagnosis Date  . Coronary artery disease 11/22/2007   Status post PCI with drug-eluting stent 2009, September 2012 negative outpatient stress test  . Essential hypertension, benign    Poorly controlled  . GERD (gastroesophageal reflux disease)   . Postherpetic neuralgia   . Pure hypercholesterolemia     No past surgical history on file.  Social History   Socioeconomic History  . Marital status: Married    Spouse name: Not on file  . Number of children: Not on file  . Years of education: Not on file  . Highest education level: Not on file  Occupational History  . 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 Financial resource strain: Not on file  . Food insecurity:    Worry: Not on file    Inability: Not on file  . Transportation needs:    Medical: Not on file    Non-medical: Not on file  Tobacco Use  . Smoking status: Former Smoker    Packs/day: 0.50    Years: 25.00    Pack years: 12.50    Types: Cigarettes    Start date: 09/26/1956    Last attempt to quit: 02/16/1978    Years since quitting: 40.0  . Smokeless tobacco: Never Used  Substance and Sexual Activity  . Alcohol use:  No    Alcohol/week: 0.0 standard drinks  . Drug use: No  . Sexual activity: Not on file  Lifestyle  . Physical activity:    Days per week: Not on file    Minutes per session: Not on file  . Stress: Not on file  Relationships  . Social connections:    Talks on phone: Not on file    Gets together: Not on file    Attends religious service: Not on file    Active member of club or organization: Not on file    Attends meetings of clubs or organizations: Not on file    Relationship status: Not on file  . Intimate partner violence:    Fear of current or ex partner: Not on file    Emotionally abused: Not on file    Physically abused: Not on file    Forced sexual activity: Not on file  Other Topics Concern  . Not on file  Social  History Narrative  . Not on file     Vitals:   02/15/18 1441  BP: (!) 154/72  Pulse: 68  SpO2: 94%  Weight: 189 lb (85.7 kg)  Height: 5\' 10"  (1.778 m)    Wt Readings from Last 3 Encounters:  02/15/18 189 lb (85.7 kg)  08/10/17 193 lb (87.5 kg)  02/04/17 199 lb (90.3 kg)     PHYSICAL EXAM General: NAD HEENT: Normal. Neck: No JVD, no thyromegaly. Lungs: Clear to auscultation bilaterally with normal respiratory effort. CV: Regular rate and rhythm, normal S1/S2, no S3/S4, no murmur. No pretibial or periankle edema.  No carotid bruit.   Abdomen: Soft, nontender, no distention.  Neurologic: Alert and oriented.  Psych: Normal affect. Skin: Normal. Musculoskeletal: No gross deformities.    ECG: Reviewed above under Subjective   Labs: Lab Results  Component Value Date/Time   K 3.4 (L) 11/22/2007 03:25 AM   BUN 15 11/22/2007 03:25 AM   CREATININE 1.09 11/22/2007 03:25 AM   HGB 15.8 11/22/2007 03:25 AM     Lipids: No results found for: LDLCALC, LDLDIRECT, CHOL, TRIG, HDL     ASSESSMENT AND PLAN: 1.  Coronary artery disease: Symptomatically stable on aspirin, metoprolol, Imdur, and pravastatin.  Most recent nuclear stress test detailed above.  No changes to therapy.  2. Essential XVQ:MGQQP pressure remains elevated.  I will increase lisinopril to 10 mg.  3. Hyperlipidemia: On pravastatin. I will obtain a copy of lipids from PCP.  4. Exertional dyspnea: Symptomatically stable.  Spirometry results detailed above with a moderate ventilatory defect and obstruction.  He was evaluated by pulmonary.    Disposition: Follow up 1 year   Kate Sable, M.D., F.A.C.C.

## 2018-02-15 NOTE — Addendum Note (Signed)
Addended by: Laurine Blazer on: 02/15/2018 03:08 PM   Modules accepted: Orders

## 2018-02-15 NOTE — Patient Instructions (Addendum)
Medication Instructions:   Increase Lisinopril to 10mg  daily.   Continue all other medications.    Labwork: none  Testing/Procedures: none  Follow-Up: Your physician wants you to follow up in:  1 year.  You will receive a reminder letter in the mail one-two months in advance.  If you don't receive a letter, please call our office to schedule the follow up appointment   Any Other Special Instructions Will Be Listed Below (If Applicable).  If you need a refill on your cardiac medications before your next appointment, please call your pharmacy.

## 2018-02-17 ENCOUNTER — Other Ambulatory Visit: Payer: Self-pay | Admitting: Cardiovascular Disease

## 2018-02-18 ENCOUNTER — Encounter: Payer: Self-pay | Admitting: *Deleted

## 2018-03-28 DIAGNOSIS — M170 Bilateral primary osteoarthritis of knee: Secondary | ICD-10-CM | POA: Diagnosis not present

## 2018-03-28 DIAGNOSIS — J449 Chronic obstructive pulmonary disease, unspecified: Secondary | ICD-10-CM | POA: Diagnosis not present

## 2018-03-28 DIAGNOSIS — M1009 Idiopathic gout, multiple sites: Secondary | ICD-10-CM | POA: Diagnosis not present

## 2018-03-28 DIAGNOSIS — I10 Essential (primary) hypertension: Secondary | ICD-10-CM | POA: Diagnosis not present

## 2018-03-30 DIAGNOSIS — L570 Actinic keratosis: Secondary | ICD-10-CM | POA: Diagnosis not present

## 2018-04-04 DIAGNOSIS — M1712 Unilateral primary osteoarthritis, left knee: Secondary | ICD-10-CM | POA: Diagnosis not present

## 2018-04-06 DIAGNOSIS — H04122 Dry eye syndrome of left lacrimal gland: Secondary | ICD-10-CM | POA: Diagnosis not present

## 2018-04-06 DIAGNOSIS — H04121 Dry eye syndrome of right lacrimal gland: Secondary | ICD-10-CM | POA: Diagnosis not present

## 2018-04-06 DIAGNOSIS — H25813 Combined forms of age-related cataract, bilateral: Secondary | ICD-10-CM | POA: Diagnosis not present

## 2018-04-06 DIAGNOSIS — H43813 Vitreous degeneration, bilateral: Secondary | ICD-10-CM | POA: Diagnosis not present

## 2018-07-04 DIAGNOSIS — M170 Bilateral primary osteoarthritis of knee: Secondary | ICD-10-CM | POA: Diagnosis not present

## 2018-07-04 DIAGNOSIS — J449 Chronic obstructive pulmonary disease, unspecified: Secondary | ICD-10-CM | POA: Diagnosis not present

## 2018-07-04 DIAGNOSIS — I10 Essential (primary) hypertension: Secondary | ICD-10-CM | POA: Diagnosis not present

## 2018-07-04 DIAGNOSIS — M1009 Idiopathic gout, multiple sites: Secondary | ICD-10-CM | POA: Diagnosis not present

## 2018-07-05 DIAGNOSIS — H2513 Age-related nuclear cataract, bilateral: Secondary | ICD-10-CM | POA: Diagnosis not present

## 2018-07-05 DIAGNOSIS — H18413 Arcus senilis, bilateral: Secondary | ICD-10-CM | POA: Diagnosis not present

## 2018-07-05 DIAGNOSIS — H2512 Age-related nuclear cataract, left eye: Secondary | ICD-10-CM | POA: Diagnosis not present

## 2018-07-05 DIAGNOSIS — H40013 Open angle with borderline findings, low risk, bilateral: Secondary | ICD-10-CM | POA: Diagnosis not present

## 2018-07-05 DIAGNOSIS — H25013 Cortical age-related cataract, bilateral: Secondary | ICD-10-CM | POA: Diagnosis not present

## 2018-07-05 DIAGNOSIS — H25043 Posterior subcapsular polar age-related cataract, bilateral: Secondary | ICD-10-CM | POA: Diagnosis not present

## 2018-07-11 DIAGNOSIS — H2512 Age-related nuclear cataract, left eye: Secondary | ICD-10-CM | POA: Diagnosis not present

## 2018-07-12 DIAGNOSIS — H2511 Age-related nuclear cataract, right eye: Secondary | ICD-10-CM | POA: Diagnosis not present

## 2018-07-29 ENCOUNTER — Other Ambulatory Visit: Payer: Self-pay | Admitting: Cardiovascular Disease

## 2018-08-01 DIAGNOSIS — H2511 Age-related nuclear cataract, right eye: Secondary | ICD-10-CM | POA: Diagnosis not present

## 2018-09-01 IMAGING — CT CT CHEST W/O CM
2 of 3 series · 15 of 36 positions shown, 18 images · non-contrast
Comparison: Chest radiograph August 14, 2016

CLINICAL DATA: Shortness of breath.  History of asbestos exposure

EXAM:
CT CHEST WITHOUT CONTRAST
TECHNIQUE: Multidetector CT imaging of the chest was performed following the
standard protocol without IV contrast.

[Series 2: thorax · axial · 0.76mm/px · z∈[+1300,+1566]mm · 12 of 157 slices shown, 15 images]
[im 12/157  mediastinal]
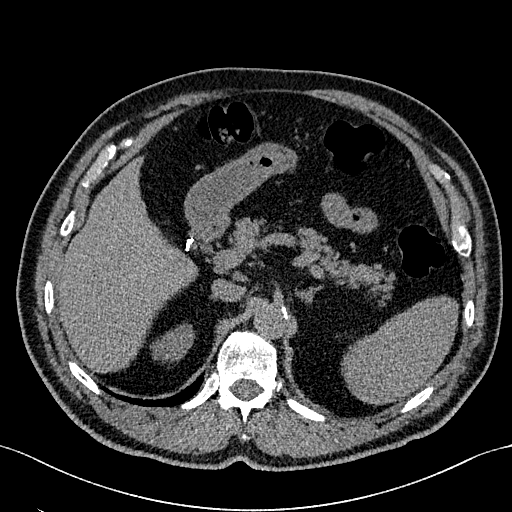
[im 12/157  lung]
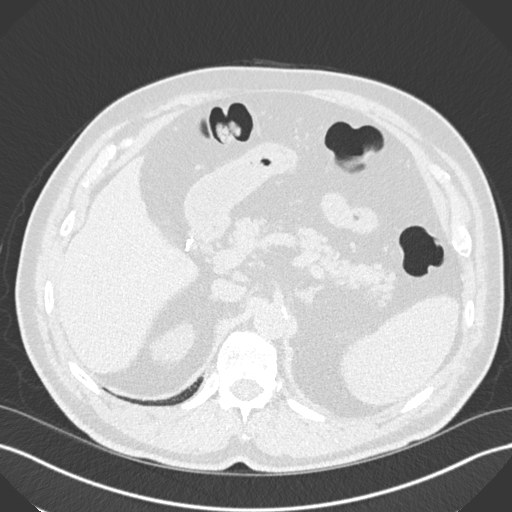
[im 24/157  lung]
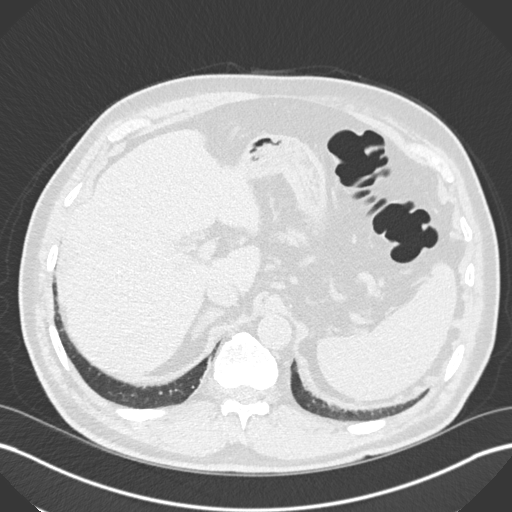
[im 35/157  lung]
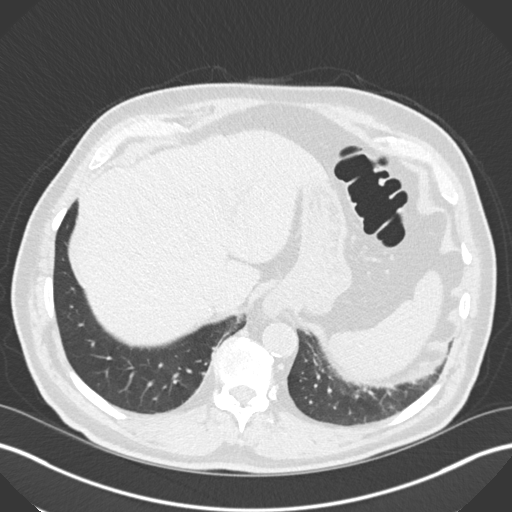
[im 47/157  lung]
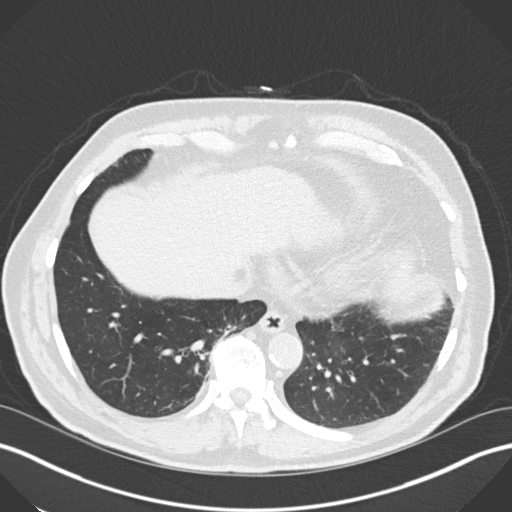
[im 58/157  mediastinal]
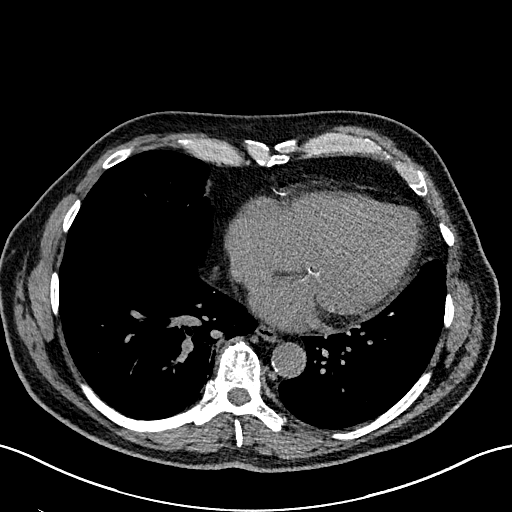
[im 58/157  lung]
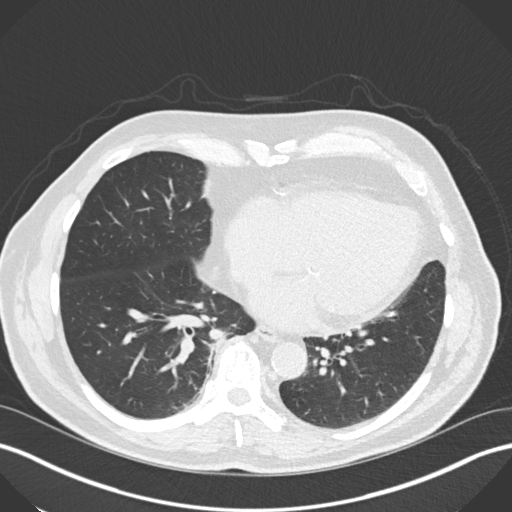
[im 70/157  lung]
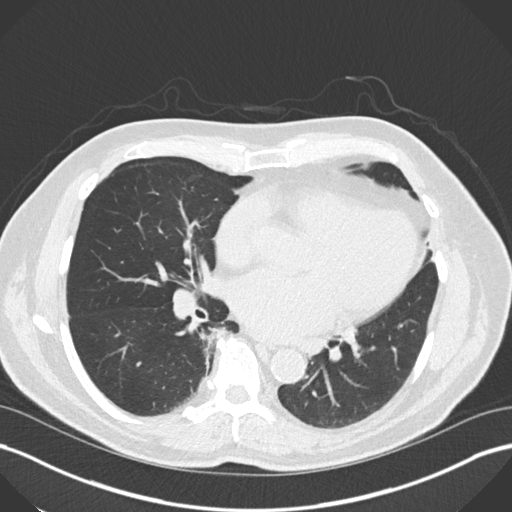
[im 87/157  lung]
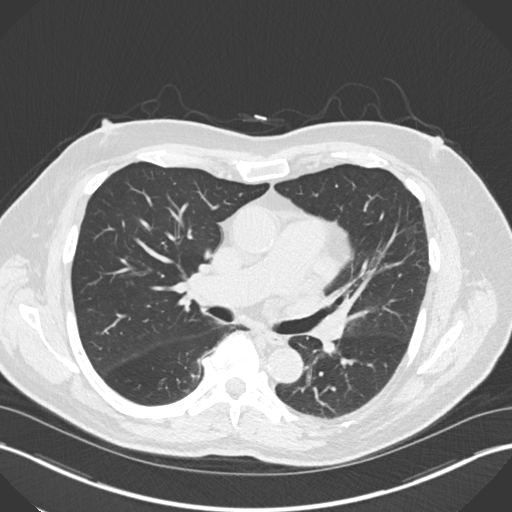
[im 99/157  lung]
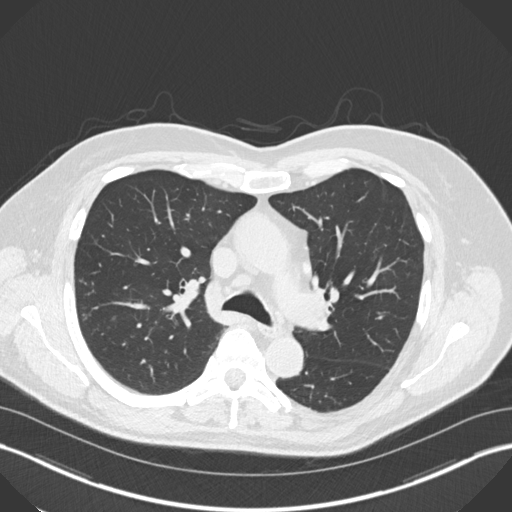
[im 110/157  mediastinal]
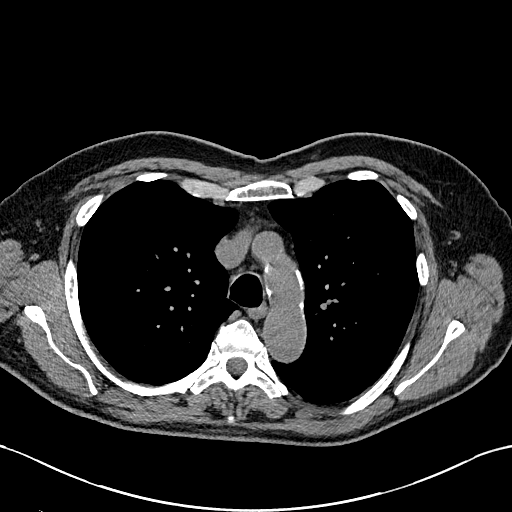
[im 110/157  lung]
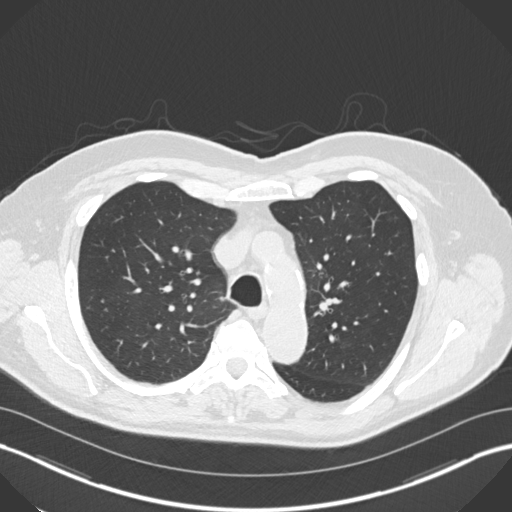
[im 122/157  lung]
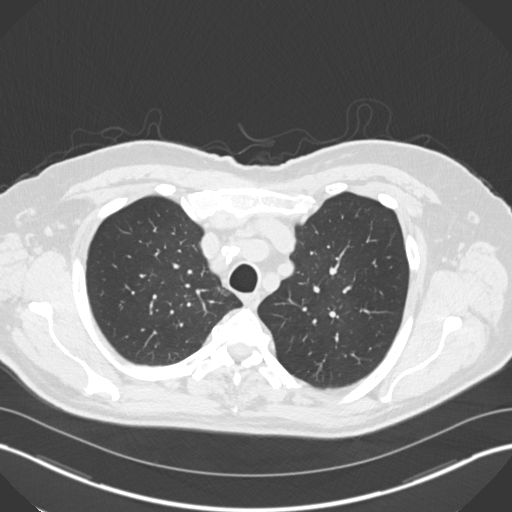
[im 133/157  lung]
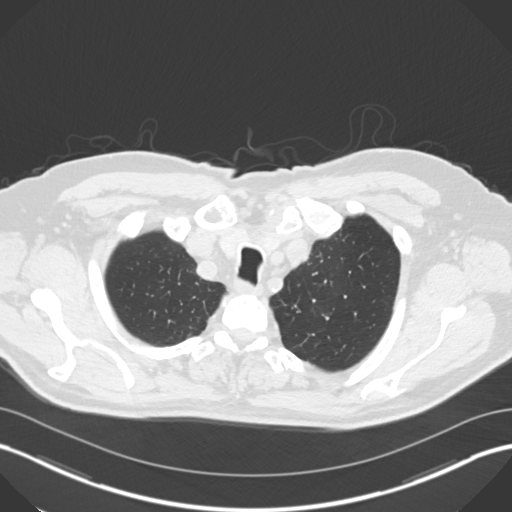
[im 145/157  lung]
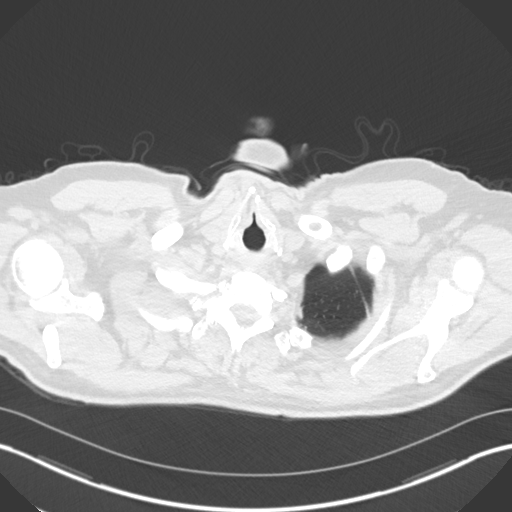

[Series 5: coronal · coronal · 0.66mm/px · 3 of 151 slices shown]
[im 31/151  lung]
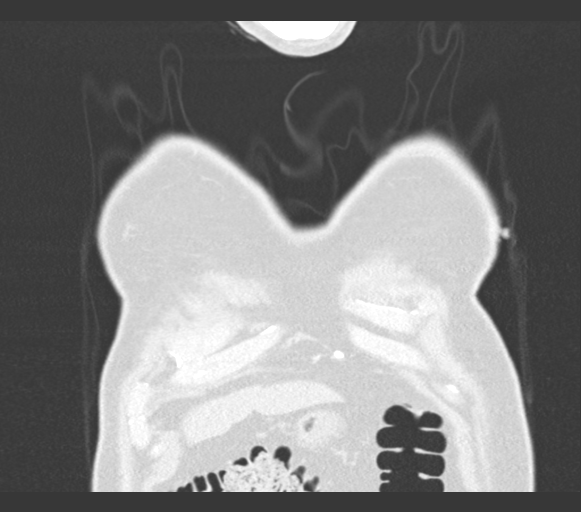
[im 61/151  lung]
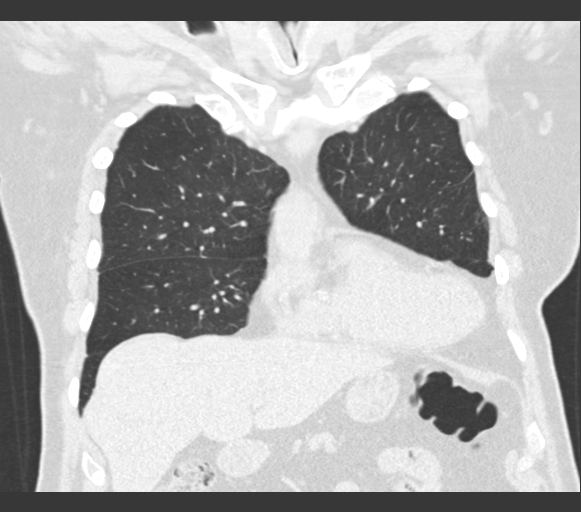
[im 91/151  lung]
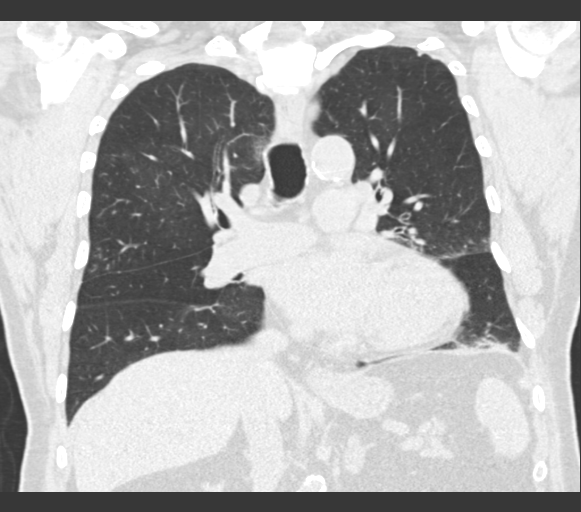

[15 of 36 positions shown; findings below may reference images not displayed]

FINDINGS: Cardiovascular: There is no appreciable thoracic aortic aneurysm.
There are foci of calcification in the proximal visualized great
vessels. Note that the right and left common carotid arteries arise
as a common trunk, an anatomic variant. There is thoracic aortic
atherosclerosis. There are foci of coronary artery calcification.
There is no pericardial effusion or pericardial thickening.

Mediastinum/Nodes: Thyroid appears normal. There are scattered
subcentimeter mediastinal lymph nodes. By size criteria, there is no
adenopathy in thoracic region. No esophageal lesions are evident.

Lungs/Pleura: There are multiple 1-2 mm nodular opacities in the
right upper lobe. This appearance is consistent with underlying
nodular interstitial type disease. There are areas of mild scarring
in each lung base and in the inferior lingula. There are no larger
pulmonary nodular lesions. There is no edema or consolidation. No
pleural effusion or pleural thickening. No pleural plaques are
evident on this study. No diaphragmatic calcification is evident.

Upper Abdomen: In the visualized upper abdomen, gallbladder is
absent. There is aortic atherosclerosis. Visualized upper abdominal
structures otherwise appear unremarkable.

Musculoskeletal: There is anterior wedging of the T12 vertebral
body. There are no blastic or lytic bone lesions.
IMPRESSION: 1. Areas of mild scarring bilaterally. Reticular interstitial
disease in the posterior segment right upper lobe. These findings
potentially may be secondary to prior asbestos exposure. Note that
there no pleural plaques or diaphragmatic calcification. No edema or
consolidation.

2.  No evident thoracic adenopathy.

3. Aortic and great vessel calcification. There are foci of coronary
artery calcification.

4.  Gallbladder absent.

Aortic Atherosclerosis ([id number].P).

## 2018-09-16 ENCOUNTER — Other Ambulatory Visit: Payer: Self-pay

## 2018-10-28 DIAGNOSIS — M1009 Idiopathic gout, multiple sites: Secondary | ICD-10-CM | POA: Diagnosis not present

## 2018-10-28 DIAGNOSIS — J449 Chronic obstructive pulmonary disease, unspecified: Secondary | ICD-10-CM | POA: Diagnosis not present

## 2018-10-28 DIAGNOSIS — I10 Essential (primary) hypertension: Secondary | ICD-10-CM | POA: Diagnosis not present

## 2018-10-28 DIAGNOSIS — M170 Bilateral primary osteoarthritis of knee: Secondary | ICD-10-CM | POA: Diagnosis not present

## 2018-11-01 DIAGNOSIS — H40013 Open angle with borderline findings, low risk, bilateral: Secondary | ICD-10-CM | POA: Diagnosis not present

## 2018-11-01 DIAGNOSIS — Z961 Presence of intraocular lens: Secondary | ICD-10-CM | POA: Diagnosis not present

## 2018-11-01 DIAGNOSIS — H02834 Dermatochalasis of left upper eyelid: Secondary | ICD-10-CM | POA: Diagnosis not present

## 2018-11-01 DIAGNOSIS — H02831 Dermatochalasis of right upper eyelid: Secondary | ICD-10-CM | POA: Diagnosis not present

## 2018-11-01 DIAGNOSIS — H18413 Arcus senilis, bilateral: Secondary | ICD-10-CM | POA: Diagnosis not present

## 2018-12-06 ENCOUNTER — Other Ambulatory Visit: Payer: Self-pay | Admitting: Cardiovascular Disease

## 2018-12-21 DIAGNOSIS — M1712 Unilateral primary osteoarthritis, left knee: Secondary | ICD-10-CM | POA: Diagnosis not present

## 2019-01-26 ENCOUNTER — Other Ambulatory Visit: Payer: Self-pay | Admitting: Cardiovascular Disease

## 2019-02-22 DIAGNOSIS — M1712 Unilateral primary osteoarthritis, left knee: Secondary | ICD-10-CM | POA: Diagnosis not present

## 2019-02-24 ENCOUNTER — Ambulatory Visit: Payer: Federal, State, Local not specified - PPO | Admitting: Cardiovascular Disease

## 2019-02-26 ENCOUNTER — Other Ambulatory Visit: Payer: Self-pay | Admitting: Cardiovascular Disease

## 2019-03-29 DIAGNOSIS — L570 Actinic keratosis: Secondary | ICD-10-CM | POA: Diagnosis not present

## 2019-03-31 ENCOUNTER — Ambulatory Visit (INDEPENDENT_AMBULATORY_CARE_PROVIDER_SITE_OTHER): Payer: Medicare Other | Admitting: Cardiovascular Disease

## 2019-03-31 ENCOUNTER — Other Ambulatory Visit: Payer: Self-pay

## 2019-03-31 ENCOUNTER — Encounter: Payer: Self-pay | Admitting: Cardiovascular Disease

## 2019-03-31 VITALS — BP 130/62 | HR 60 | Ht 70.5 in | Wt 195.0 lb

## 2019-03-31 DIAGNOSIS — I25118 Atherosclerotic heart disease of native coronary artery with other forms of angina pectoris: Secondary | ICD-10-CM

## 2019-03-31 DIAGNOSIS — I10 Essential (primary) hypertension: Secondary | ICD-10-CM | POA: Diagnosis not present

## 2019-03-31 DIAGNOSIS — R0609 Other forms of dyspnea: Secondary | ICD-10-CM

## 2019-03-31 DIAGNOSIS — E785 Hyperlipidemia, unspecified: Secondary | ICD-10-CM

## 2019-03-31 DIAGNOSIS — R0600 Dyspnea, unspecified: Secondary | ICD-10-CM | POA: Diagnosis not present

## 2019-03-31 NOTE — Progress Notes (Addendum)
SUBJECTIVE: The patient presents for routine follow-up. He has a history of coronary artery disease and underwent drug-eluting stent placement in 2009. Nuclear stress test on 11/12/2016 was normal. He has a moderate ventilatory defect with obstruction seen by spirometry which I ordered for progressive exertional dyspnea.  The patient denies any symptoms of chest pain, palpitations, shortness of breath, lightheadedness, dizziness, leg swelling, orthopnea, PND, and syncope.  I personally reviewed the ECG performed the office today which demonstrates sinus bradycardia, 57 bpm.   Soc Hx: Married x 20 yrs, divorced, now remarried >30 years. Served a year in [place] in late 1980's.  Review of Systems: As per "subjective", otherwise negative.  No Known Allergies  Current Outpatient Medications  Medication Sig Dispense Refill  . allopurinol (ZYLOPRIM) 300 MG tablet Take 1 tablet by mouth daily.    Marland Kitchen amLODipine (NORVASC) 10 MG tablet TAKE 1 TABLET BY MOUTH DAILY 90 tablet 0  . aspirin 81 MG tablet Take 81 mg by mouth daily.    . chlorthalidone (HYGROTON) 25 MG tablet TAKE 1 TABLET BY MOUTH DAILY 90 tablet 0  . cholecalciferol (VITAMIN D) 1000 UNITS tablet Take 2,000 Units by mouth daily.      . isosorbide mononitrate (IMDUR) 60 MG 24 hr tablet Take 60 mg by mouth daily.      Marland Kitchen lisinopril (ZESTRIL) 10 MG tablet TAKE 1 TABLET BY MOUTH EVERY DAY 90 tablet 0  . meloxicam (MOBIC) 7.5 MG tablet Take 1 tablet by mouth daily.    . metoprolol tartrate (LOPRESSOR) 50 MG tablet TAKE 1 TABLET BY MOUTH TWICE DAILY 180 tablet 0  . nitroGLYCERIN (NITROSTAT) 0.4 MG SL tablet Place 1 tablet (0.4 mg total) under the tongue every 5 (five) minutes as needed. 25 tablet 3  . pravastatin (PRAVACHOL) 40 MG tablet TAKE 1 TABLET BY MOUTH EVERY EVENING 90 tablet 0  . terazosin (HYTRIN) 5 MG capsule Take 5 mg by mouth at bedtime.       No current facility-administered medications for this visit.    Past  Medical History:  Diagnosis Date  . Coronary artery disease 11/22/2007   Status post PCI with drug-eluting stent 2009, September 2012 negative outpatient stress test  . Essential hypertension, benign    Poorly controlled  . GERD (gastroesophageal reflux disease)   . Postherpetic neuralgia   . Pure hypercholesterolemia     No past surgical history on file.  Social History   Socioeconomic History  . Marital status: Married    Spouse name: Not on file  . Number of children: Not on file  . Years of education: Not on file  . Highest education level: Not on file  Occupational History  . 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 Smoking status: Former Smoker    Packs/day: 0.50    Years: 25.00    Pack years: 12.50    Types: Cigarettes    Start date: 09/26/1956    Quit date: 02/16/1978    Years since quitting: 41.1  . Smokeless tobacco: Never Used  Substance and Sexual Activity  . Alcohol use: No    Alcohol/week: 0.0 standard drinks  . Drug use: No  . Sexual activity: Not on file  Other Topics Concern  . Not on file  Social History Narrative  . Not on file   Social Determinants of Health   Financial Resource Strain:   . Difficulty of Paying Living Expenses: Not  on file  Food Insecurity:   . Worried About Charity fundraiser in the Last Year: Not on file  . Ran Out of Food in the Last Year: Not on file  Transportation Needs:   . Lack of Transportation (Medical): Not on file  . Lack of Transportation (Non-Medical): Not on file  Physical Activity:   . Days of Exercise per Week: Not on file  . Minutes of Exercise per Session: Not on file  Stress:   . Feeling of Stress : Not on file  Social Connections:   . Frequency of Communication with Friends and Family: Not on file  . Frequency of Social Gatherings with Friends and Family: Not on file  . Attends Religious Services: Not on file  . Active Member of Clubs or Organizations: Not on file   . Attends Archivist Meetings: Not on file  . Marital Status: Not on file  Intimate Partner Violence:   . Fear of Current or Ex-Partner: Not on file  . Emotionally Abused: Not on file  . Physically Abused: Not on file  . Sexually Abused: Not on file     Vitals:   03/31/19 0924  BP: 130/62  Pulse: 60  SpO2: 96%  Weight: 195 lb (88.5 kg)  Height: 5' 10.5" (1.791 m)    Wt Readings from Last 3 Encounters:  03/31/19 195 lb (88.5 kg)  02/15/18 189 lb (85.7 kg)  08/10/17 193 lb (87.5 kg)     PHYSICAL EXAM General: NAD HEENT: Normal. Neck: No JVD, no thyromegaly. Lungs: Clear to auscultation bilaterally with normal respiratory effort. CV: Regular rate and rhythm, normal S1/S2, no S3/S4, no murmur. No pretibial or periankle edema.  No carotid bruit.   Abdomen: Soft, nontender, no distention.  Neurologic: Alert and oriented.  Psych: Normal affect. Skin: Normal. Musculoskeletal: No gross deformities.      Labs: Lab Results  Component Value Date/Time   K 3.4 (L) 11/22/2007 03:25 AM   BUN 15 11/22/2007 03:25 AM   CREATININE 1.09 11/22/2007 03:25 AM   HGB 15.8 11/22/2007 03:25 AM     Lipids: No results found for: LDLCALC, LDLDIRECT, CHOL, TRIG, HDL     ASSESSMENT AND PLAN:  1.Coronary artery disease:Symptomatically stable on aspirin, metoprolol, Imdur, and pravastatin. Most recent nuclear stress test detailed above. No changes to therapy.  2. Essential [id number] pressure is normal.  No changes to therapy.  3. Hyperlipidemia: Continue pravastatin.  4. Exertional dyspnea: Symptomatically stable.  Spirometry results detailed above with a moderate ventilatory defect and obstruction.He was evaluated by pulmonary.   Disposition: Follow up 1 yr.   Kate Sable, M.D., F.A.C.C.

## 2019-03-31 NOTE — Patient Instructions (Signed)

## 2019-05-04 ENCOUNTER — Other Ambulatory Visit: Payer: Self-pay | Admitting: Cardiovascular Disease

## 2019-05-08 DIAGNOSIS — H40003 Preglaucoma, unspecified, bilateral: Secondary | ICD-10-CM | POA: Diagnosis not present

## 2019-05-12 DIAGNOSIS — S61206A Unspecified open wound of right little finger without damage to nail, initial encounter: Secondary | ICD-10-CM | POA: Diagnosis not present

## 2019-05-17 DIAGNOSIS — Z0000 Encounter for general adult medical examination without abnormal findings: Secondary | ICD-10-CM | POA: Diagnosis not present

## 2019-05-17 DIAGNOSIS — Z1331 Encounter for screening for depression: Secondary | ICD-10-CM | POA: Diagnosis not present

## 2019-05-17 DIAGNOSIS — M170 Bilateral primary osteoarthritis of knee: Secondary | ICD-10-CM | POA: Diagnosis not present

## 2019-05-17 DIAGNOSIS — M1009 Idiopathic gout, multiple sites: Secondary | ICD-10-CM | POA: Diagnosis not present

## 2019-05-17 DIAGNOSIS — J449 Chronic obstructive pulmonary disease, unspecified: Secondary | ICD-10-CM | POA: Diagnosis not present

## 2019-05-17 DIAGNOSIS — I10 Essential (primary) hypertension: Secondary | ICD-10-CM | POA: Diagnosis not present

## 2019-05-24 DIAGNOSIS — M1712 Unilateral primary osteoarthritis, left knee: Secondary | ICD-10-CM | POA: Diagnosis not present

## 2019-05-27 DIAGNOSIS — S61219A Laceration without foreign body of unspecified finger without damage to nail, initial encounter: Secondary | ICD-10-CM | POA: Diagnosis not present

## 2019-06-05 ENCOUNTER — Other Ambulatory Visit: Payer: Self-pay | Admitting: Cardiovascular Disease

## 2019-07-12 DIAGNOSIS — M1712 Unilateral primary osteoarthritis, left knee: Secondary | ICD-10-CM | POA: Diagnosis not present

## 2019-10-13 DIAGNOSIS — M1712 Unilateral primary osteoarthritis, left knee: Secondary | ICD-10-CM | POA: Diagnosis not present

## 2019-11-29 DIAGNOSIS — J449 Chronic obstructive pulmonary disease, unspecified: Secondary | ICD-10-CM | POA: Diagnosis not present

## 2019-11-29 DIAGNOSIS — M170 Bilateral primary osteoarthritis of knee: Secondary | ICD-10-CM | POA: Diagnosis not present

## 2019-11-29 DIAGNOSIS — M1009 Idiopathic gout, multiple sites: Secondary | ICD-10-CM | POA: Diagnosis not present

## 2019-11-29 DIAGNOSIS — I10 Essential (primary) hypertension: Secondary | ICD-10-CM | POA: Diagnosis not present

## 2020-01-16 DIAGNOSIS — Z6827 Body mass index (BMI) 27.0-27.9, adult: Secondary | ICD-10-CM | POA: Diagnosis not present

## 2020-01-16 DIAGNOSIS — M1712 Unilateral primary osteoarthritis, left knee: Secondary | ICD-10-CM | POA: Diagnosis not present

## 2020-03-26 DIAGNOSIS — L814 Other melanin hyperpigmentation: Secondary | ICD-10-CM | POA: Diagnosis not present

## 2020-03-26 DIAGNOSIS — L570 Actinic keratosis: Secondary | ICD-10-CM | POA: Diagnosis not present

## 2020-03-26 DIAGNOSIS — L853 Xerosis cutis: Secondary | ICD-10-CM | POA: Diagnosis not present

## 2020-03-26 DIAGNOSIS — L821 Other seborrheic keratosis: Secondary | ICD-10-CM | POA: Diagnosis not present

## 2020-04-01 ENCOUNTER — Encounter: Payer: Self-pay | Admitting: Family Medicine

## 2020-04-01 ENCOUNTER — Ambulatory Visit (INDEPENDENT_AMBULATORY_CARE_PROVIDER_SITE_OTHER): Payer: Medicare Other | Admitting: Family Medicine

## 2020-04-01 VITALS — BP 132/68 | HR 75 | Ht 70.5 in | Wt 178.0 lb

## 2020-04-01 DIAGNOSIS — R0600 Dyspnea, unspecified: Secondary | ICD-10-CM

## 2020-04-01 DIAGNOSIS — R0609 Other forms of dyspnea: Secondary | ICD-10-CM

## 2020-04-01 DIAGNOSIS — E782 Mixed hyperlipidemia: Secondary | ICD-10-CM | POA: Diagnosis not present

## 2020-04-01 DIAGNOSIS — I10 Essential (primary) hypertension: Secondary | ICD-10-CM

## 2020-04-01 DIAGNOSIS — I2510 Atherosclerotic heart disease of native coronary artery without angina pectoris: Secondary | ICD-10-CM

## 2020-04-01 NOTE — Progress Notes (Signed)
Cardiology Office Note  Date: 04/01/2020   ID: [name], DOB 02-13-1934, MRN [id number]  PCP:  Frank Deiters, MD  Cardiologist:  Frank Docker, MD (Inactive) Electrophysiologist:  None   Chief Complaint: Cardiac follow-up 1 year.  History of Present Illness: [name] is a 87 y.o. male with a history of CAD, HTN, GERD, HLD, DOE.  Last encounter with Dr. Purvis Sheffield 03/31/2019.  He denies any symptoms of chest pain, palpitations, shortness of breath, lightheadedness, dizziness, lower extremity edema, orthopnea, PND, syncope.  Previous DES placement 2009.  Nuclear stress test 2018 was normal.  Moderate ventilatory defect with extra symptoms seen on spirometry for progressive dyspnea.  He was stable on aspirin, metoprolol, Imdur, pravastatin.  Blood pressure was normal and no changes to therapy.  He was continuing pravastatin.  He was evaluated by pulmonary for his moderate ventilatory defect and obstruction.  He is here today for 1 year follow-up.  He denies any recent acute illnesses or hospitalizations.  He denies any anginal or exertional symptoms, palpitations or arrhythmias, orthostatic symptoms, PND, orthopnea, CVA or TIA-like symptoms.  No issues with bleeding, claudication, DVT or PE-like symptoms or lower extremity edema.  There have been no medication changes in the interim since last visit.  He continues on amlodipine, chlorthalidone, aspirin, Imdur, lisinopril, metoprolol, pravastatin, nitroglycerin without side effects.    Past Medical History:  Diagnosis Date  . Coronary artery disease 11/22/2007   Status post PCI with drug-eluting stent 2009, September 2012 negative outpatient stress test  . Essential hypertension, benign    Poorly controlled  . GERD (gastroesophageal reflux disease)   . Postherpetic neuralgia   . Pure hypercholesterolemia     No past surgical history on file.  Current Outpatient Medications  Medication Sig  Dispense Refill  . allopurinol (ZYLOPRIM) 300 MG tablet Take 1 tablet by mouth daily.    Marland Kitchen amLODipine (NORVASC) 10 MG tablet TAKE 1 TABLET BY MOUTH DAILY 90 tablet 3  . aspirin 81 MG tablet Take 81 mg by mouth daily.    . chlorthalidone (HYGROTON) 25 MG tablet TAKE 1 TABLET BY MOUTH DAILY 90 tablet 3  . cholecalciferol (VITAMIN D) 1000 UNITS tablet Take 2,000 Units by mouth daily.    . isosorbide mononitrate (IMDUR) 60 MG 24 hr tablet Take 60 mg by mouth daily.    Marland Kitchen lisinopril (ZESTRIL) 10 MG tablet TAKE 1 TABLET BY MOUTH EVERY DAY 90 tablet 3  . meloxicam (MOBIC) 7.5 MG tablet Take 1 tablet by mouth daily.    . metoprolol tartrate (LOPRESSOR) 50 MG tablet TAKE 1 TABLET BY MOUTH TWICE DAILY 180 tablet 3  . nitroGLYCERIN (NITROSTAT) 0.4 MG SL tablet Place 1 tablet (0.4 mg total) under the tongue every 5 (five) minutes as needed. 25 tablet 3  . pravastatin (PRAVACHOL) 40 MG tablet TAKE 1 TABLET BY MOUTH EVERY EVENING 90 tablet 3  . terazosin (HYTRIN) 5 MG capsule Take 5 mg by mouth at bedtime.       No current facility-administered medications for this visit.   Allergies:  Patient has no known allergies.   Social History: The patient  reports that he quit smoking about 42 years ago. His smoking use included cigarettes. He started smoking about 63 years ago. He has a 12.50 pack-year smoking history. He has never used smokeless tobacco. He reports that he does not drink alcohol and does not use drugs.   Family History: The patient's family history includes Coronary artery  disease in his brother; Heart attack in his sister.   ROS:  Please see the history of present illness. Otherwise, complete review of systems is positive for none.  All other systems are reviewed and negative.   Physical Exam: VS:  BP 132/68   Pulse 75   Ht 5' 10.5" (1.791 m)   Wt 178 lb (80.7 kg)   SpO2 95%   BMI 25.18 kg/m , BMI Body mass index is 25.18 kg/m.  Wt Readings from Last 3 Encounters:  04/01/20 178 lb  (80.7 kg)  03/31/19 195 lb (88.5 kg)  02/15/18 189 lb (85.7 kg)    General: Patient appears comfortable at rest. Neck: Supple, no elevated JVP or carotid bruits, no thyromegaly. Lungs: Clear to auscultation, nonlabored breathing at rest. Cardiac: Regular rate and rhythm, no S3 or significant systolic murmur, no pericardial rub. Extremities: No pitting edema, distal pulses 2+. Skin: Warm and dry. Musculoskeletal: No kyphosis. Neuropsychiatric: Alert and oriented x3, affect grossly appropriate.  ECG:  EKG March 31, 2019 sinus bradycardia with a rate of 57  Recent Labwork: No results found for requested labs within last 8760 hours.  No results found for: CHOL, TRIG, HDL, CHOLHDL, VLDL, LDLCALC, LDLDIRECT  Other Studies Reviewed Today:  CT chest 03/11/2017 with history of exposure to asbestos IMPRESSION: 1. Areas of mild scarring bilaterally. Reticular interstitial disease in the posterior segment right upper lobe. These findings potentially may be secondary to prior asbestos exposure. Note that there no pleural plaques or diaphragmatic calcification. No edema or consolidation.  2.  No evident thoracic adenopathy.  3. Aortic and great vessel calcification. There are foci of coronary artery calcification.  4.  Gallbladder absent.  Aortic Atherosclerosis (ICD10-I70.0).     NST 10/23/2016 Study Result  Narrative & Impression   There was no ST segment deviation noted during stress.  The study is normal. No ischemia or scar.  This is a low risk study.  Nuclear stress EF: 60%.      Assessment and Plan:  1. CAD in native artery   2. Essential hypertension, benign   3. Mixed hyperlipidemia   4. Exertional dyspnea    1. CAD in native artery Denies any anginal or exertional symptoms.  Continue aspirin 81 mg daily.  Sublingual nitroglycerin as needed.  Continue metoprolol 50 mg p.o. twice daily  2. Essential hypertension, benign Blood pressure well controlled  on current therapy.  Today's blood pressure 132/68.  Continue amlodipine 10 mg daily.  Chlorthalidone 25 mg daily.  Lisinopril 10 mg daily.  Metoprolol tartrate 50 mg p.o. twice daily.  3. Mixed hyperlipidemia Continue pravastatin 40 mg daily.  4. Exertional dyspnea Has some issues with some chronic exertional dyspnea.  No recent dyspnea.  History of ventilatory defect on previous spirometry.  Medication Adjustments/Labs and Tests Ordered: Current medicines are reviewed at length with the patient today.  Concerns regarding medicines are outlined above.   Disposition: Follow-up with Dr. Wyline Mood or APP 1 year  Signed, Rennis Harding, NP 04/01/2020 2:57 PM    [facility] at [facility] [address] [place], [place], [place] [address] Phone: [phone]; Fax: [phone]

## 2020-04-01 NOTE — Patient Instructions (Signed)

## 2020-04-03 ENCOUNTER — Ambulatory Visit: Payer: Medicare Other | Admitting: Physician Assistant

## 2020-04-18 DIAGNOSIS — I10 Essential (primary) hypertension: Secondary | ICD-10-CM | POA: Diagnosis not present

## 2020-04-18 DIAGNOSIS — M1009 Idiopathic gout, multiple sites: Secondary | ICD-10-CM | POA: Diagnosis not present

## 2020-04-18 DIAGNOSIS — J449 Chronic obstructive pulmonary disease, unspecified: Secondary | ICD-10-CM | POA: Diagnosis not present

## 2020-04-18 DIAGNOSIS — M170 Bilateral primary osteoarthritis of knee: Secondary | ICD-10-CM | POA: Diagnosis not present

## 2020-04-19 ENCOUNTER — Other Ambulatory Visit: Payer: Self-pay | Admitting: Cardiology

## 2020-05-24 DIAGNOSIS — H40003 Preglaucoma, unspecified, bilateral: Secondary | ICD-10-CM | POA: Diagnosis not present

## 2020-06-18 ENCOUNTER — Other Ambulatory Visit: Payer: Self-pay | Admitting: Cardiology

## 2020-08-27 DIAGNOSIS — J449 Chronic obstructive pulmonary disease, unspecified: Secondary | ICD-10-CM | POA: Diagnosis not present

## 2020-08-27 DIAGNOSIS — M1009 Idiopathic gout, multiple sites: Secondary | ICD-10-CM | POA: Diagnosis not present

## 2020-08-27 DIAGNOSIS — I10 Essential (primary) hypertension: Secondary | ICD-10-CM | POA: Diagnosis not present

## 2020-08-27 DIAGNOSIS — Z0000 Encounter for general adult medical examination without abnormal findings: Secondary | ICD-10-CM | POA: Diagnosis not present

## 2020-08-27 DIAGNOSIS — Z1331 Encounter for screening for depression: Secondary | ICD-10-CM | POA: Diagnosis not present

## 2020-08-27 DIAGNOSIS — M170 Bilateral primary osteoarthritis of knee: Secondary | ICD-10-CM | POA: Diagnosis not present

## 2020-11-28 DIAGNOSIS — I10 Essential (primary) hypertension: Secondary | ICD-10-CM | POA: Diagnosis not present

## 2020-11-28 DIAGNOSIS — M1009 Idiopathic gout, multiple sites: Secondary | ICD-10-CM | POA: Diagnosis not present

## 2020-11-28 DIAGNOSIS — I700 Atherosclerosis of aorta: Secondary | ICD-10-CM | POA: Diagnosis not present

## 2020-11-28 DIAGNOSIS — M170 Bilateral primary osteoarthritis of knee: Secondary | ICD-10-CM | POA: Diagnosis not present

## 2020-11-28 DIAGNOSIS — J449 Chronic obstructive pulmonary disease, unspecified: Secondary | ICD-10-CM | POA: Diagnosis not present

## 2021-03-06 DIAGNOSIS — J449 Chronic obstructive pulmonary disease, unspecified: Secondary | ICD-10-CM | POA: Diagnosis not present

## 2021-03-06 DIAGNOSIS — I10 Essential (primary) hypertension: Secondary | ICD-10-CM | POA: Diagnosis not present

## 2021-03-06 DIAGNOSIS — I700 Atherosclerosis of aorta: Secondary | ICD-10-CM | POA: Diagnosis not present

## 2021-03-06 DIAGNOSIS — M1009 Idiopathic gout, multiple sites: Secondary | ICD-10-CM | POA: Diagnosis not present

## 2021-03-06 DIAGNOSIS — M170 Bilateral primary osteoarthritis of knee: Secondary | ICD-10-CM | POA: Diagnosis not present

## 2021-03-26 DIAGNOSIS — L570 Actinic keratosis: Secondary | ICD-10-CM | POA: Diagnosis not present

## 2021-04-17 ENCOUNTER — Other Ambulatory Visit: Payer: Self-pay | Admitting: *Deleted

## 2021-04-17 ENCOUNTER — Other Ambulatory Visit: Payer: Self-pay | Admitting: Cardiology

## 2021-04-17 MED ORDER — AMLODIPINE BESYLATE 10 MG PO TABS: 10.0000 mg | ORAL_TABLET | Freq: Every day | ORAL | 1 refills | Status: DC

## 2021-04-17 MED ORDER — CHLORTHALIDONE 25 MG PO TABS: 25.0000 mg | ORAL_TABLET | Freq: Every day | ORAL | 1 refills | Status: DC

## 2021-04-17 MED ORDER — PRAVASTATIN SODIUM 40 MG PO TABS: 40.0000 mg | ORAL_TABLET | Freq: Every evening | ORAL | 1 refills | Status: DC

## 2021-05-26 DIAGNOSIS — H26493 Other secondary cataract, bilateral: Secondary | ICD-10-CM | POA: Diagnosis not present

## 2021-06-05 DIAGNOSIS — J449 Chronic obstructive pulmonary disease, unspecified: Secondary | ICD-10-CM | POA: Diagnosis not present

## 2021-06-05 DIAGNOSIS — I700 Atherosclerosis of aorta: Secondary | ICD-10-CM | POA: Diagnosis not present

## 2021-06-05 DIAGNOSIS — I10 Essential (primary) hypertension: Secondary | ICD-10-CM | POA: Diagnosis not present

## 2021-06-05 DIAGNOSIS — M170 Bilateral primary osteoarthritis of knee: Secondary | ICD-10-CM | POA: Diagnosis not present

## 2021-06-05 DIAGNOSIS — M1009 Idiopathic gout, multiple sites: Secondary | ICD-10-CM | POA: Diagnosis not present

## 2021-06-19 ENCOUNTER — Ambulatory Visit (INDEPENDENT_AMBULATORY_CARE_PROVIDER_SITE_OTHER): Payer: Medicare Other | Admitting: Cardiology

## 2021-06-19 ENCOUNTER — Encounter: Payer: Self-pay | Admitting: Cardiology

## 2021-06-19 VITALS — BP 114/50 | HR 57 | Ht 70.0 in | Wt 177.0 lb

## 2021-06-19 DIAGNOSIS — I10 Essential (primary) hypertension: Secondary | ICD-10-CM | POA: Diagnosis not present

## 2021-06-19 DIAGNOSIS — E782 Mixed hyperlipidemia: Secondary | ICD-10-CM | POA: Diagnosis not present

## 2021-06-19 DIAGNOSIS — I25119 Atherosclerotic heart disease of native coronary artery with unspecified angina pectoris: Secondary | ICD-10-CM

## 2021-06-19 NOTE — Progress Notes (Signed)
? ? ?Cardiology Office Note ? ?Date: 06/19/2021  ? ?ID: Sherri Rad Sr Kristoff, DOB [birth year], MRN [id number] ? ?PCP:  Toma Deiters, MD  ?Cardiologist:  Nona Dell, MD ?Electrophysiologist:  None  ? ?Chief Complaint  ?Patient presents with  ? Cardiac follow-up  ? ? ?History of Present Illness: ?[name] is an 88 y.o. male former patient of Dr. Purvis Sheffield now presenting to establish follow-up with me.  He was last seen in February 2022 by Mr. Vincenza Hews NP.  I reviewed his records and updated the chart. ? ?He is here for a routine visit.  States that he feels quite well.  Takes care of his own home and yard work.  No angina symptoms or nitroglycerin use.  NYHA class I dyspnea.  He has had no palpitations or syncope. ? ?I reviewed his medications.  He reports compliance with therapy.  We are requesting his most recent lab work from PCP.  I personally reviewed his ECG today which shows sinus bradycardia. ? ?Past Medical History:  ?Diagnosis Date  ? Coronary artery disease 11/22/2007  ? DES to LAD 2009, mild to moderate residual disease  ? Essential hypertension   ? GERD (gastroesophageal reflux disease)   ? Mixed hyperlipidemia   ? Postherpetic neuralgia   ? ? ?History reviewed. No pertinent surgical history. ? ?Current Outpatient Medications  ?Medication Sig Dispense Refill  ? allopurinol (ZYLOPRIM) 300 MG tablet Take 1 tablet by mouth daily.    ? amLODipine (NORVASC) 10 MG tablet Take 1 tablet (10 mg total) by mouth daily. 90 tablet 1  ? aspirin 81 MG tablet Take 81 mg by mouth daily.    ? chlorthalidone (HYGROTON) 25 MG tablet Take 1 tablet (25 mg total) by mouth daily. 90 tablet 1  ? cholecalciferol (VITAMIN D) 1000 UNITS tablet Take 2,000 Units by mouth daily.    ? isosorbide mononitrate (IMDUR) 60 MG 24 hr tablet Take 60 mg by mouth daily.    ? lisinopril (ZESTRIL) 10 MG tablet TAKE 1 TABLET BY MOUTH EVERY DAY 90 tablet 1  ? meloxicam (MOBIC) 7.5 MG tablet Take 1 tablet by mouth daily.    ?  metoprolol tartrate (LOPRESSOR) 50 MG tablet TAKE 1 TABLET BY MOUTH TWICE DAILY 180 tablet 1  ? nitroGLYCERIN (NITROSTAT) 0.4 MG SL tablet Place 1 tablet (0.4 mg total) under the tongue every 5 (five) minutes as needed. 25 tablet 3  ? pravastatin (PRAVACHOL) 40 MG tablet Take 1 tablet (40 mg total) by mouth every evening. 90 tablet 1  ? terazosin (HYTRIN) 5 MG capsule Take 5 mg by mouth at bedtime.      ? ?No current facility-administered medications for this visit.  ? ?Allergies:  Patient has no known allergies.  ? ?ROS: No orthopnea or PND. ? ?Physical Exam: ?VS:  BP (!) 114/50   Pulse (!) 57   Ht 5\' 10"  (1.778 m)   Wt 177 lb (80.3 kg)   SpO2 94%   BMI 25.40 kg/m? , BMI Body mass index is 25.4 kg/m?. ? ?Wt Readings from Last 3 Encounters:  ?06/19/21 177 lb (80.3 kg)  ?04/01/20 178 lb (80.7 kg)  ?03/31/19 195 lb (88.5 kg)  ?  ?General: Patient appears comfortable at rest. ?HEENT: Conjunctiva and lids normal. ?Neck: Supple, no elevated JVP or carotid bruits, no thyromegaly. ?Lungs: Clear to auscultation, nonlabored breathing at rest. ?Cardiac: Regular rate and rhythm, no S3 or significant systolic murmur, no pericardial rub. ?Extremities: No pitting edema. ? ?ECG:  An ECG dated 03/31/2019 was personally reviewed today and demonstrated:  Sinus bradycardia. ? ?Recent Labwork: ? ?No interval lab work for review today. ? ?Other Studies Reviewed Today: ? ?Lexiscan Myoview 11/12/2016: ?There was no ST segment deviation noted during stress. ?The study is normal. No ischemia or scar. ?This is a low risk study. ?Nuclear stress EF: 60%. ? ?Assessment and Plan: ? ?1.  CAD status post DES to the LAD in 2009 with mild to moderate residual disease.  He has done very well without active angina or nitroglycerin use.  Functionally active.  ECG reviewed.  Continue aspirin, lisinopril, Lopressor, Norvasc, chlorthalidone, Pravachol, and as needed nitroglycerin. ? ?2.  Mixed hyperlipidemia on Pravachol.  Requesting most recent lab  work from PCP. ? ?3.  Essential hypertension, no changes made today in current regimen with normal blood pressure. ? ?Medication Adjustments/Labs and Tests Ordered: ?Current medicines are reviewed at length with the patient today.  Concerns regarding medicines are outlined above.  ? ?Tests Ordered: ?Orders Placed This Encounter  ?Procedures  ? EKG 12-Lead  ? ? ?Medication Changes: ?No orders of the defined types were placed in this encounter. ? ? ?Disposition:  Follow up  1 year. ? ?Signed, ?Jonelle Sidle, MD, [facility] ?06/19/2021 11:59 AM    ?[facility] at [facility] ?[address] [place], [place], [place] [address] ?Phone: [phone]; Fax: [phone]  ?

## 2021-06-19 NOTE — Patient Instructions (Signed)

## 2021-09-09 DIAGNOSIS — J449 Chronic obstructive pulmonary disease, unspecified: Secondary | ICD-10-CM | POA: Diagnosis not present

## 2021-09-09 DIAGNOSIS — M1009 Idiopathic gout, multiple sites: Secondary | ICD-10-CM | POA: Diagnosis not present

## 2021-09-09 DIAGNOSIS — Z0000 Encounter for general adult medical examination without abnormal findings: Secondary | ICD-10-CM | POA: Diagnosis not present

## 2021-09-09 DIAGNOSIS — M170 Bilateral primary osteoarthritis of knee: Secondary | ICD-10-CM | POA: Diagnosis not present

## 2021-09-09 DIAGNOSIS — I10 Essential (primary) hypertension: Secondary | ICD-10-CM | POA: Diagnosis not present

## 2021-09-09 DIAGNOSIS — Z1331 Encounter for screening for depression: Secondary | ICD-10-CM | POA: Diagnosis not present

## 2021-09-09 DIAGNOSIS — I700 Atherosclerosis of aorta: Secondary | ICD-10-CM | POA: Diagnosis not present

## 2021-10-12 ENCOUNTER — Other Ambulatory Visit: Payer: Self-pay | Admitting: Cardiology

## 2021-12-16 DIAGNOSIS — J449 Chronic obstructive pulmonary disease, unspecified: Secondary | ICD-10-CM | POA: Diagnosis not present

## 2021-12-16 DIAGNOSIS — M170 Bilateral primary osteoarthritis of knee: Secondary | ICD-10-CM | POA: Diagnosis not present

## 2021-12-16 DIAGNOSIS — I700 Atherosclerosis of aorta: Secondary | ICD-10-CM | POA: Diagnosis not present

## 2021-12-16 DIAGNOSIS — I10 Essential (primary) hypertension: Secondary | ICD-10-CM | POA: Diagnosis not present

## 2021-12-16 DIAGNOSIS — M1009 Idiopathic gout, multiple sites: Secondary | ICD-10-CM | POA: Diagnosis not present

## 2022-01-19 DIAGNOSIS — H26491 Other secondary cataract, right eye: Secondary | ICD-10-CM | POA: Diagnosis not present

## 2022-03-25 DIAGNOSIS — L570 Actinic keratosis: Secondary | ICD-10-CM | POA: Diagnosis not present

## 2022-03-25 DIAGNOSIS — Z1283 Encounter for screening for malignant neoplasm of skin: Secondary | ICD-10-CM | POA: Diagnosis not present

## 2022-03-25 DIAGNOSIS — D239 Other benign neoplasm of skin, unspecified: Secondary | ICD-10-CM | POA: Diagnosis not present

## 2022-03-25 DIAGNOSIS — L853 Xerosis cutis: Secondary | ICD-10-CM | POA: Diagnosis not present

## 2022-03-30 DIAGNOSIS — Z13820 Encounter for screening for osteoporosis: Secondary | ICD-10-CM | POA: Diagnosis not present

## 2022-03-30 DIAGNOSIS — M810 Age-related osteoporosis without current pathological fracture: Secondary | ICD-10-CM | POA: Diagnosis not present

## 2022-03-31 DIAGNOSIS — J449 Chronic obstructive pulmonary disease, unspecified: Secondary | ICD-10-CM | POA: Diagnosis not present

## 2022-03-31 DIAGNOSIS — I10 Essential (primary) hypertension: Secondary | ICD-10-CM | POA: Diagnosis not present

## 2022-03-31 DIAGNOSIS — M170 Bilateral primary osteoarthritis of knee: Secondary | ICD-10-CM | POA: Diagnosis not present

## 2022-03-31 DIAGNOSIS — M1009 Idiopathic gout, multiple sites: Secondary | ICD-10-CM | POA: Diagnosis not present

## 2022-03-31 DIAGNOSIS — I700 Atherosclerosis of aorta: Secondary | ICD-10-CM | POA: Diagnosis not present

## 2022-04-10 ENCOUNTER — Other Ambulatory Visit: Payer: Self-pay | Admitting: Cardiology

## 2022-05-28 DIAGNOSIS — H43393 Other vitreous opacities, bilateral: Secondary | ICD-10-CM | POA: Diagnosis not present

## 2022-06-30 DIAGNOSIS — M170 Bilateral primary osteoarthritis of knee: Secondary | ICD-10-CM | POA: Diagnosis not present

## 2022-06-30 DIAGNOSIS — J449 Chronic obstructive pulmonary disease, unspecified: Secondary | ICD-10-CM | POA: Diagnosis not present

## 2022-06-30 DIAGNOSIS — I10 Essential (primary) hypertension: Secondary | ICD-10-CM | POA: Diagnosis not present

## 2022-06-30 DIAGNOSIS — M1009 Idiopathic gout, multiple sites: Secondary | ICD-10-CM | POA: Diagnosis not present

## 2022-06-30 DIAGNOSIS — I700 Atherosclerosis of aorta: Secondary | ICD-10-CM | POA: Diagnosis not present

## 2022-07-01 NOTE — Progress Notes (Unsigned)
Cardiology Office Note  Date: 07/02/2022   ID: [name], DOB Jan 25, 1934, MRN [id number]  History of Present Illness: [name] [name] is an 89 y.o. male last seen in May 2023.  He is here for a routine visit.  Reports no change in stamina, no angina, and stable NYHA class II dyspnea.  He remains functional with ADLs including yard work.  No palpitations or syncope.  He continues to follow with Dr. Olena Leatherwood for primary care.  I reviewed his recent lab work.  LDL is 40 on Pravachol 40 mg daily.  ECG shows sinus bradycardia.  Physical Exam: VS:  BP (!) 106/56   Pulse (!) 59   Ht 5\' 10"  (1.778 m)   Wt 177 lb 6.4 oz (80.5 kg)   SpO2 94%   BMI 25.45 kg/m , BMI Body mass index is 25.45 kg/m.  Wt Readings from Last 3 Encounters:  07/02/22 177 lb 6.4 oz (80.5 kg)  06/19/21 177 lb (80.3 kg)  04/01/20 178 lb (80.7 kg)    General: Patient appears comfortable at rest. HEENT: Conjunctiva and lids normal. Neck: Supple, no elevated JVP or carotid bruits. Lungs: Clear to auscultation, nonlabored breathing at rest. Cardiac: Regular rate and rhythm, no S3 or significant systolic murmur. Extremities: No pitting edema.  ECG:  An ECG dated 06/19/2021 was personally reviewed today and demonstrated:  Sinus bradycardia.  Labwork:  May 2024: BUN 17, creatinine 1.04, potassium 4.1, AST 20, ALT 12, cholesterol 103, triglycerides 64, HDL 49, LDL 40  Other Studies Reviewed Today:  No interval cardiac testing for review today.  Assessment and Plan:  1.  CAD status post DES to the LAD in 2009 with mild to moderate residual disease managed medically.  Follow-up Myoview in 2018 was low risk and LVEF 60%.  Will continue observation in the absence of change in functional status or new onset angina.  ECG reviewed and stable.  He continues on medical therapy including aspirin, Norvasc, Imdur, lisinopril, Lopressor, Pravachol, and as needed nitroglycerin.  2.  Mixed hyperlipidemia  on Pravachol.  Recent LDL 40.  3.  Essential hypertension.  Blood pressure well-controlled today on current regimen.  No changes were made.  Disposition:  Follow up  1 year, sooner if needed.  Signed, Jonelle Sidle, M.D., F.A.C.C. [facility]

## 2022-07-02 ENCOUNTER — Ambulatory Visit: Payer: Federal, State, Local not specified - PPO | Attending: Cardiology | Admitting: Cardiology

## 2022-07-02 ENCOUNTER — Encounter: Payer: Self-pay | Admitting: Cardiology

## 2022-07-02 VITALS — BP 106/56 | HR 59 | Ht 70.0 in | Wt 177.4 lb

## 2022-07-02 DIAGNOSIS — E782 Mixed hyperlipidemia: Secondary | ICD-10-CM

## 2022-07-02 DIAGNOSIS — I10 Essential (primary) hypertension: Secondary | ICD-10-CM

## 2022-07-02 DIAGNOSIS — I25119 Atherosclerotic heart disease of native coronary artery with unspecified angina pectoris: Secondary | ICD-10-CM

## 2022-07-02 NOTE — Patient Instructions (Addendum)

## 2022-10-08 ENCOUNTER — Other Ambulatory Visit: Payer: Self-pay | Admitting: Cardiology

## 2022-10-28 DIAGNOSIS — J449 Chronic obstructive pulmonary disease, unspecified: Secondary | ICD-10-CM | POA: Diagnosis not present

## 2022-10-28 DIAGNOSIS — M170 Bilateral primary osteoarthritis of knee: Secondary | ICD-10-CM | POA: Diagnosis not present

## 2022-10-28 DIAGNOSIS — M1009 Idiopathic gout, multiple sites: Secondary | ICD-10-CM | POA: Diagnosis not present

## 2022-10-28 DIAGNOSIS — I10 Essential (primary) hypertension: Secondary | ICD-10-CM | POA: Diagnosis not present

## 2022-10-28 DIAGNOSIS — I700 Atherosclerosis of aorta: Secondary | ICD-10-CM | POA: Diagnosis not present

## 2022-10-28 DIAGNOSIS — N182 Chronic kidney disease, stage 2 (mild): Secondary | ICD-10-CM | POA: Diagnosis not present

## 2022-10-28 DIAGNOSIS — Z1331 Encounter for screening for depression: Secondary | ICD-10-CM | POA: Diagnosis not present

## 2022-10-28 DIAGNOSIS — Z0000 Encounter for general adult medical examination without abnormal findings: Secondary | ICD-10-CM | POA: Diagnosis not present

## 2023-01-27 DIAGNOSIS — Z1331 Encounter for screening for depression: Secondary | ICD-10-CM | POA: Diagnosis not present

## 2023-01-27 DIAGNOSIS — M170 Bilateral primary osteoarthritis of knee: Secondary | ICD-10-CM | POA: Diagnosis not present

## 2023-01-27 DIAGNOSIS — M1009 Idiopathic gout, multiple sites: Secondary | ICD-10-CM | POA: Diagnosis not present

## 2023-01-27 DIAGNOSIS — I10 Essential (primary) hypertension: Secondary | ICD-10-CM | POA: Diagnosis not present

## 2023-01-27 DIAGNOSIS — I700 Atherosclerosis of aorta: Secondary | ICD-10-CM | POA: Diagnosis not present

## 2023-01-27 DIAGNOSIS — N182 Chronic kidney disease, stage 2 (mild): Secondary | ICD-10-CM | POA: Diagnosis not present

## 2023-01-27 DIAGNOSIS — E7849 Other hyperlipidemia: Secondary | ICD-10-CM | POA: Diagnosis not present

## 2023-01-27 DIAGNOSIS — Z0000 Encounter for general adult medical examination without abnormal findings: Secondary | ICD-10-CM | POA: Diagnosis not present

## 2023-01-27 DIAGNOSIS — J449 Chronic obstructive pulmonary disease, unspecified: Secondary | ICD-10-CM | POA: Diagnosis not present

## 2023-03-24 DIAGNOSIS — L814 Other melanin hyperpigmentation: Secondary | ICD-10-CM | POA: Diagnosis not present

## 2023-03-24 DIAGNOSIS — L570 Actinic keratosis: Secondary | ICD-10-CM | POA: Diagnosis not present

## 2023-03-24 DIAGNOSIS — L821 Other seborrheic keratosis: Secondary | ICD-10-CM | POA: Diagnosis not present

## 2023-04-09 ENCOUNTER — Other Ambulatory Visit: Payer: Self-pay | Admitting: Cardiology

## 2023-04-28 DIAGNOSIS — J449 Chronic obstructive pulmonary disease, unspecified: Secondary | ICD-10-CM | POA: Diagnosis not present

## 2023-04-28 DIAGNOSIS — E7849 Other hyperlipidemia: Secondary | ICD-10-CM | POA: Diagnosis not present

## 2023-04-28 DIAGNOSIS — N1831 Chronic kidney disease, stage 3a: Secondary | ICD-10-CM | POA: Diagnosis not present

## 2023-04-28 DIAGNOSIS — M170 Bilateral primary osteoarthritis of knee: Secondary | ICD-10-CM | POA: Diagnosis not present

## 2023-04-28 DIAGNOSIS — I700 Atherosclerosis of aorta: Secondary | ICD-10-CM | POA: Diagnosis not present

## 2023-04-28 DIAGNOSIS — M1009 Idiopathic gout, multiple sites: Secondary | ICD-10-CM | POA: Diagnosis not present

## 2023-04-28 DIAGNOSIS — I10 Essential (primary) hypertension: Secondary | ICD-10-CM | POA: Diagnosis not present

## 2023-05-31 DIAGNOSIS — H04123 Dry eye syndrome of bilateral lacrimal glands: Secondary | ICD-10-CM | POA: Diagnosis not present

## 2023-08-08 DIAGNOSIS — E7800 Pure hypercholesterolemia, unspecified: Secondary | ICD-10-CM | POA: Diagnosis not present

## 2023-08-08 DIAGNOSIS — D696 Thrombocytopenia, unspecified: Secondary | ICD-10-CM | POA: Diagnosis not present

## 2023-08-08 DIAGNOSIS — I2510 Atherosclerotic heart disease of native coronary artery without angina pectoris: Secondary | ICD-10-CM | POA: Diagnosis not present

## 2023-08-08 DIAGNOSIS — I6782 Cerebral ischemia: Secondary | ICD-10-CM | POA: Diagnosis not present

## 2023-08-08 DIAGNOSIS — R42 Dizziness and giddiness: Secondary | ICD-10-CM | POA: Diagnosis not present

## 2023-08-08 DIAGNOSIS — K219 Gastro-esophageal reflux disease without esophagitis: Secondary | ICD-10-CM | POA: Diagnosis not present

## 2023-08-08 DIAGNOSIS — Z87891 Personal history of nicotine dependence: Secondary | ICD-10-CM | POA: Diagnosis not present

## 2023-08-08 DIAGNOSIS — I6789 Other cerebrovascular disease: Secondary | ICD-10-CM | POA: Diagnosis not present

## 2023-08-08 DIAGNOSIS — Z955 Presence of coronary angioplasty implant and graft: Secondary | ICD-10-CM | POA: Diagnosis not present

## 2023-08-08 DIAGNOSIS — Z79899 Other long term (current) drug therapy: Secondary | ICD-10-CM | POA: Diagnosis not present

## 2023-08-08 DIAGNOSIS — G319 Degenerative disease of nervous system, unspecified: Secondary | ICD-10-CM | POA: Diagnosis not present

## 2023-08-08 DIAGNOSIS — R55 Syncope and collapse: Secondary | ICD-10-CM | POA: Diagnosis not present

## 2023-08-08 DIAGNOSIS — R531 Weakness: Secondary | ICD-10-CM | POA: Diagnosis not present

## 2023-08-08 DIAGNOSIS — I10 Essential (primary) hypertension: Secondary | ICD-10-CM | POA: Diagnosis not present

## 2023-08-09 DIAGNOSIS — R55 Syncope and collapse: Secondary | ICD-10-CM | POA: Diagnosis not present

## 2023-08-09 DIAGNOSIS — M170 Bilateral primary osteoarthritis of knee: Secondary | ICD-10-CM | POA: Diagnosis not present

## 2023-08-09 DIAGNOSIS — M1009 Idiopathic gout, multiple sites: Secondary | ICD-10-CM | POA: Diagnosis not present

## 2023-08-09 DIAGNOSIS — N1831 Chronic kidney disease, stage 3a: Secondary | ICD-10-CM | POA: Diagnosis not present

## 2023-08-09 DIAGNOSIS — D696 Thrombocytopenia, unspecified: Secondary | ICD-10-CM | POA: Diagnosis not present

## 2023-08-09 DIAGNOSIS — I700 Atherosclerosis of aorta: Secondary | ICD-10-CM | POA: Diagnosis not present

## 2023-08-09 DIAGNOSIS — J449 Chronic obstructive pulmonary disease, unspecified: Secondary | ICD-10-CM | POA: Diagnosis not present

## 2023-08-09 DIAGNOSIS — I10 Essential (primary) hypertension: Secondary | ICD-10-CM | POA: Diagnosis not present

## 2023-08-10 ENCOUNTER — Other Ambulatory Visit (HOSPITAL_COMMUNITY)
Admission: RE | Admit: 2023-08-10 | Discharge: 2023-08-10 | Disposition: A | Discharge status: Home / Self Care | Source: Ambulatory Visit | Attending: Internal Medicine | Admitting: Internal Medicine

## 2023-08-10 DIAGNOSIS — D696 Thrombocytopenia, unspecified: Secondary | ICD-10-CM | POA: Diagnosis not present

## 2023-08-10 LAB — PLATELET FUNCTION ASSAY
Collagen / ADP: 290 s — ABNORMAL HIGH (ref 0–118)
Collagen / Epinephrine: >300 s — ABNORMAL HIGH (ref 0–193)

## 2023-08-12 ENCOUNTER — Encounter: Payer: Self-pay | Admitting: Oncology

## 2023-08-12 ENCOUNTER — Other Ambulatory Visit: Payer: Self-pay

## 2023-08-12 ENCOUNTER — Ambulatory Visit: Payer: Self-pay

## 2023-08-12 ENCOUNTER — Inpatient Hospital Stay (HOSPITAL_BASED_OUTPATIENT_CLINIC_OR_DEPARTMENT_OTHER): Admitting: Oncology

## 2023-08-12 ENCOUNTER — Inpatient Hospital Stay: Attending: Oncology | Admitting: Oncology

## 2023-08-12 VITALS — BP 161/71 | HR 62 | Temp 98.0°F | Resp 16 | Ht 71.0 in | Wt 162.5 lb

## 2023-08-12 DIAGNOSIS — R0602 Shortness of breath: Secondary | ICD-10-CM | POA: Diagnosis not present

## 2023-08-12 DIAGNOSIS — D696 Thrombocytopenia, unspecified: Secondary | ICD-10-CM

## 2023-08-12 DIAGNOSIS — J45909 Unspecified asthma, uncomplicated: Secondary | ICD-10-CM | POA: Insufficient documentation

## 2023-08-12 DIAGNOSIS — R5383 Other fatigue: Secondary | ICD-10-CM | POA: Insufficient documentation

## 2023-08-12 DIAGNOSIS — E538 Deficiency of other specified B group vitamins: Secondary | ICD-10-CM

## 2023-08-12 DIAGNOSIS — Z87891 Personal history of nicotine dependence: Secondary | ICD-10-CM | POA: Insufficient documentation

## 2023-08-12 DIAGNOSIS — Z791 Long term (current) use of non-steroidal anti-inflammatories (NSAID): Secondary | ICD-10-CM | POA: Diagnosis not present

## 2023-08-12 DIAGNOSIS — Z7982 Long term (current) use of aspirin: Secondary | ICD-10-CM | POA: Diagnosis not present

## 2023-08-12 DIAGNOSIS — Z79899 Other long term (current) drug therapy: Secondary | ICD-10-CM | POA: Insufficient documentation

## 2023-08-12 LAB — PROTIME-INR
INR: 1 (ref 0.8–1.2)
Prothrombin Time: 13.8 s (ref 11.4–15.2)

## 2023-08-12 LAB — CBC WITH DIFFERENTIAL/PLATELET
Abs Immature Granulocytes: 0.05 10*3/uL (ref 0.00–0.07)
Basophils Absolute: 0 10*3/uL (ref 0.0–0.1)
Basophils Relative: 0 %
Eosinophils Absolute: 0.1 10*3/uL (ref 0.0–0.5)
Eosinophils Relative: 1 %
HCT: 48.1 % (ref 39.0–52.0)
Hemoglobin: 16.2 g/dL (ref 13.0–17.0)
Immature Granulocytes: 1 %
Lymphocytes Relative: 23 %
Lymphs Abs: 1.7 10*3/uL (ref 0.7–4.0)
MCH: 31.1 pg (ref 26.0–34.0)
MCHC: 33.7 g/dL (ref 30.0–36.0)
MCV: 92.3 fL (ref 80.0–100.0)
Monocytes Absolute: 0.9 10*3/uL (ref 0.1–1.0)
Monocytes Relative: 12 %
Neutro Abs: 4.6 10*3/uL (ref 1.7–7.7)
Neutrophils Relative %: 63 %
Platelets: 24 10*3/uL — CL (ref 150–400)
RBC: 5.21 MIL/uL (ref 4.22–5.81)
RDW: 14.3 % (ref 11.5–15.5)
WBC: 7.4 10*3/uL (ref 4.0–10.5)
nRBC: 0 % (ref 0.0–0.2)

## 2023-08-12 LAB — VITAMIN B12: Vitamin B-12: 122 pg/mL — ABNORMAL LOW (ref 180–914)

## 2023-08-12 LAB — IRON AND TIBC
Iron: 115 ug/dL (ref 45–182)
Saturation Ratios: 45 % — ABNORMAL HIGH (ref 17.9–39.5)
TIBC: 257 ug/dL (ref 250–450)
UIBC: 142 ug/dL

## 2023-08-12 LAB — HEPATITIS PANEL, ACUTE
HCV Ab: NONREACTIVE
Hep A IgM: NONREACTIVE
Hep B C IgM: NONREACTIVE
Hepatitis B Surface Ag: NONREACTIVE

## 2023-08-12 LAB — URIC ACID: Uric Acid, Serum: 4.2 mg/dL (ref 3.7–8.6)

## 2023-08-12 LAB — RETICULOCYTES
Immature Retic Fract: 5.9 % (ref 2.3–15.9)
RBC.: 5.17 MIL/uL (ref 4.22–5.81)
Retic Count, Absolute: 46.5 10*3/uL (ref 19.0–186.0)
Retic Ct Pct: 0.9 % (ref 0.4–3.1)

## 2023-08-12 LAB — COMPREHENSIVE METABOLIC PANEL WITH GFR
ALT: 16 U/L (ref 0–44)
AST: 25 U/L (ref 15–41)
Albumin: 4.2 g/dL (ref 3.5–5.0)
Alkaline Phosphatase: 57 U/L (ref 38–126)
Anion gap: 12 (ref 5–15)
BUN: 19 mg/dL (ref 8–23)
CO2: 28 mmol/L (ref 22–32)
Calcium: 9.5 mg/dL (ref 8.9–10.3)
Chloride: 99 mmol/L (ref 98–111)
Creatinine, Ser: 1.03 mg/dL (ref 0.61–1.24)
GFR, Estimated: >60 mL/min (ref >60)
Glucose, Bld: 99 mg/dL (ref 70–99)
Potassium: 3.6 mmol/L (ref 3.5–5.1)
Sodium: 139 mmol/L (ref 135–145)
Total Bilirubin: 1 mg/dL (ref 0.0–1.2)
Total Protein: 7.6 g/dL (ref 6.5–8.1)

## 2023-08-12 LAB — RETIC PANEL
Immature Retic Fract: 8.1 % (ref 2.3–15.9)
RBC.: 5.22 MIL/uL (ref 4.22–5.81)
Retic Count, Absolute: 53.2 10*3/uL (ref 19.0–186.0)
Retic Ct Pct: 1 % (ref 0.4–3.1)
Reticulocyte Hemoglobin: 35.2 pg (ref >27.9)

## 2023-08-12 LAB — FERRITIN: Ferritin: 169 ng/mL (ref 24–336)

## 2023-08-12 LAB — D-DIMER, QUANTITATIVE: D-Dimer, Quant: 0.27 ug{FEU}/mL (ref 0.00–0.50)

## 2023-08-12 LAB — FOLATE: Folate: 13.8 ng/mL (ref >5.9)

## 2023-08-12 LAB — SAVE SMEAR(SSMR), FOR PROVIDER SLIDE REVIEW

## 2023-08-12 LAB — LACTATE DEHYDROGENASE: LDH: 167 U/L (ref 98–192)

## 2023-08-12 LAB — TSH: TSH: 1.713 u[IU]/mL (ref 0.350–4.500)

## 2023-08-12 LAB — APTT: aPTT: 33 s (ref 24–36)

## 2023-08-12 MED ORDER — VITAMIN B-12 1000 MCG PO TABS: 1000.0000 ug | ORAL_TABLET | Freq: Every day | ORAL | 2 refills | Status: AC

## 2023-08-12 NOTE — Progress Notes (Signed)
 [facility] at [facility]  HEMATOLOGY NEW VISIT  Frank Yancey LABOR, MD  REASON FOR REFERRAL: Severe thrombocytopenia  HISTORY OF PRESENT ILLNESS: [name] 89 y.o. male referred for severe thrombocytopenia. The patient is accompanied by his wife today.  Patient reports no complaints today.  He experienced a syncopal episode at church, which he attributes to a drop in blood pressure possibly due to his medications. At the hospital, his platelet count was found to be significantly low at 17, and later increased to 27 after a repeat test by his primary care physician. No recent bleeding or bruising. No history of liver problems or alcohol use.  He recently completed a course of amoxicillin for a dental infection about three weeks ago, taking one capsule daily for five to six days. No allergic reactions to the medication.  He experiences fatigue and shortness of breath, particularly after exertion, such as mowing the lawn or checking the pool. His wife notes that he sometimes appears short of breath even without physical exertion. No recent fever, chills, weight loss, or night sweats.  He has a history of a stent placement in the early 1990s and has been on various medications for many years, including blood pressure medications. He has not been taking his blood pressure medication consistently since the syncope episode, noting that his blood pressure readings have been around 140/75 without the medication.  He has a past history of asthma, which was treated with shots in his youth, and he denies having COPD despite being told otherwise. He smoked in the past but was not a heavy smoker.  Patient was never told that he had low platelets.  Patient overall, is of very good functional status and has no increased bleeding recently or petechiae or bruises   I have reviewed the past medical history, past surgical history, social history and family history with the  patient   ALLERGIES:  has no known allergies.  MEDICATIONS:  Current Outpatient Medications  Medication Sig Dispense Refill   cyanocobalamin (VITAMIN B12) 1000 MCG tablet Take 1 tablet (1,000 mcg total) by mouth daily. 90 tablet 2   SHINGRIX injection Inject 0.5 mLs into the muscle once.     allopurinol (ZYLOPRIM) 300 MG tablet Take 1 tablet by mouth daily.     amLODipine  (NORVASC ) 10 MG tablet TAKE 1 TABLET BY MOUTH EVERY DAY 90 tablet 1   aspirin 81 MG tablet Take 81 mg by mouth daily.     chlorthalidone  (HYGROTON ) 25 MG tablet TAKE 1 TABLET BY MOUTH EVERY DAY 90 tablet 1   cholecalciferol (VITAMIN D) 1000 UNITS tablet Take 2,000 Units by mouth daily.     isosorbide mononitrate (IMDUR) 60 MG 24 hr tablet Take 60 mg by mouth daily.     lisinopril  (ZESTRIL ) 10 MG tablet TAKE 1 TABLET BY MOUTH EVERY DAY 90 tablet 1   meloxicam (MOBIC) 7.5 MG tablet Take 1 tablet by mouth daily.     metoprolol  tartrate (LOPRESSOR ) 50 MG tablet TAKE 1 TABLET BY MOUTH TWICE DAILY 180 tablet 1   nitroGLYCERIN  (NITROSTAT ) 0.4 MG SL tablet Place 1 tablet (0.4 mg total) under the tongue every 5 (five) minutes as needed. 25 tablet 3   potassium chloride SA (KLOR-CON M) 20 MEQ tablet Take 20 mEq by mouth daily.     pravastatin  (PRAVACHOL ) 40 MG tablet TAKE 1 TABLET BY MOUTH EVERY EVENING 90 tablet 1   terazosin  (HYTRIN ) 5 MG capsule Take 5 mg by  mouth at bedtime.   (Patient not taking: Reported on 08/12/2023)     No current facility-administered medications for this visit.     REVIEW OF SYSTEMS:   Constitutional: Denies fevers, chills or night sweats Eyes: Denies blurriness of vision Ears, nose, mouth, throat, and face: Denies mucositis or sore throat Respiratory: Denies cough, dyspnea or wheezes Cardiovascular: Denies palpitation, chest discomfort or lower extremity swelling Gastrointestinal:  Denies nausea, heartburn or change in bowel habits Skin: Denies abnormal skin rashes Lymphatics: Denies new  lymphadenopathy or easy bruising Neurological:Denies numbness, tingling or new weaknesses Behavioral/Psych: Mood is stable, no new changes  All other systems were reviewed with the patient and are negative.  PHYSICAL EXAMINATION:   Vitals:   08/12/23 0826  BP: (!) 161/71  Pulse: 62  Resp: 16  Temp: 98 F (36.7 C)  SpO2: 96%    GENERAL:alert, no distress and comfortable SKIN: skin color, texture, turgor are normal, no rashes or significant lesions LYMPH:  no palpable lymphadenopathy in the cervical, axillary or inguinal LUNGS: clear to auscultation and percussion with normal breathing effort HEART: regular rate & rhythm and no murmurs and no lower extremity edema ABDOMEN:abdomen soft, non-tender and normal bowel sounds Musculoskeletal:no cyanosis of digits and no clubbing  NEURO: alert & oriented x 3 with fluent speech, no focal motor/sensory deficits  LABORATORY DATA:  I have reviewed the data as listed Labs from 08/08/2023: CBC: Hemoglobin: 14.4, WBC: 5.9, platelets: 17 CMP: Potassium: 3.8, creatinine: 1.47, GFR: 46, calcium: 9, AST: 17, ALT: 11, ALP: 57  Lab Results  Component Value Date   WBC 7.4 08/12/2023   NEUTROABS 4.6 08/12/2023   HGB 16.2 08/12/2023   HCT 48.1 08/12/2023   MCV 92.3 08/12/2023   PLT 24 (LL) 08/12/2023     Chemistry      Component Value Date/Time   NA 139 08/12/2023 0940   K 3.6 08/12/2023 0940   CL 99 08/12/2023 0940   CO2 28 08/12/2023 0940   BUN 19 08/12/2023 0940   CREATININE 1.03 08/12/2023 0940      Component Value Date/Time   CALCIUM 9.5 08/12/2023 0940   ALKPHOS 57 08/12/2023 0940   AST 25 08/12/2023 0940   ALT 16 08/12/2023 0940   BILITOT 1.0 08/12/2023 0940      Latest Reference Range & Units 08/12/23 09:41  Iron 45 - 182 ug/dL 884  UIBC ug/dL 857  TIBC 749 - 549 ug/dL 742  Saturation Ratios 17.9 - 39.5 % 45 (H)  Ferritin 24 - 336 ng/mL 169  Folate >5.9 ng/mL 13.8  Vitamin B12 180 - 914 pg/mL 122 (L)  (H): Data is  abnormally high (L): Data is abnormally low  Latest Reference Range & Units 08/12/23 09:41  RBC. 4.22 - 5.81 MIL/uL 5.17  Retic Ct Pct 0.4 - 3.1 % 0.9  Retic Count, Absolute 19.0 - 186.0 K/uL 46.5  Immature Retic Fract 2.3 - 15.9 % 5.9    Latest Reference Range & Units 08/12/23 09:40  D-Dimer, Quant 0.00 - 0.50 ug/mL-FEU 0.27    Latest Reference Range & Units 08/12/23 09:40 08/12/23 10:00  Prothrombin Time 11.4 - 15.2 seconds 13.8   INR 0.8 - 1.2  1.0   APTT 24 - 36 seconds 33   Glucose 70 - 99 mg/dL 99   TSH 9.649 - 5.499 uIU/mL  1.713  Thyroxine (T4) 4.5 - 12.0 ug/dL  7.9    Latest Reference Range & Units 08/12/23 09:40  Hep A Ab, IgM NON REACTIVE  NON REACTIVE  Hepatitis B Surface Ag NON REACTIVE  NON REACTIVE  Hep B Core Ab, IgM NON REACTIVE  NON REACTIVE  HCV Ab NON REACTIVE  NON REACTIVE    RADIOGRAPHIC STUDIES: I have personally reviewed the radiological images as listed and agreed with the findings in the report.  US  Abdomen Complete CLINICAL DATA:  Thrombocytopenia.  History of a paddle splenomegaly.  EXAM: ABDOMEN ULTRASOUND COMPLETE  COMPARISON:  None Available.  FINDINGS: Gallbladder: Surgically removed.  Common bile duct: Diameter: 4.9 mm.  Liver: No focal lesion identified. Liver measures 15.2 cm. Increased echotexture. Portal vein is patent on color Doppler imaging with normal direction of blood flow towards the liver.  IVC: No abnormality visualized.  Pancreas: Not well seen due to overlying bowel gas.  Spleen: Size and appearance within normal limits. The spleen measures 10.5 cm.  Right Kidney: Length: 10 cm. Echogenicity within normal limits. No hydronephrosis visualized. There is a 1.2 x 1.1 x 1.1 cm simple cyst in the right kidney. No follow-up is recommended.  Left Kidney: Length: 10.8 cm. Echogenicity within normal limits. No hydronephrosis visualized. There are simple cysts within the left kidney, largest measures 2.2 x 1.8 x 2.1 cm.  No follow-up is recommended.  Abdominal aorta: No aneurysm visualized.  Other findings: None.  IMPRESSION: 1. No acute abnormality identified. No evidence a hepatosplenomegaly. 2. Increased echotexture of the liver. This is a nonspecific finding but can be seen in fatty infiltration of liver.  Electronically Signed   By: Craig Farr M.D.   On: 08/13/2023 10:30   ASSESSMENT & PLAN:  Patient is a 89 y.o. male referred for thrombocytopenia  Thrombocytopenia (HCC) Patient had chronic thrombocytopenia for several years based on lab review at least since 2009, usual range is 100-120 Recent drop to 17 and repeat of 27, platelet count 24 today, likely secondary to recent amoxicillin use and vitamin B12 deficiency Obtained nutritional studies including folate, iron panel, ferritin, vitamin B12, copper All obtain reticulocyte count, LDH, hepatitis panel, haptoglobin to rule out other causes including TTP Peripheral smear reviewed: Red blood cells: No schistocytes seen, WBC: Normal morphology, platelets: Decreased in number, occasional big platelets seen, 1 very small clump identified  - Labs today consistent with significant B12 deficiency.  Will start parenteral vitamin B12 supplementation - Start oral vitamin B12 supplementation 1000 mcg/day - Obtain ultrasound of abdomen to rule out hepatosplenomegaly -Will repeat labs in 1 week, if does not improve can consider dose of IVIG - Recommended patient to watch out for signs of bleeding if he has any altered mental status or any significant bleeding to come to the ER  Return to clinic in 1 week with labs.   Orders Placed This Encounter  Procedures   US  Abdomen Complete    Standing Status:   Future    Number of Occurrences:   1    Expected Date:   08/12/2023    Expiration Date:   08/11/2024    Reason for Exam (SYMPTOM  OR DIAGNOSIS REQUIRED):   Rule out hepatosplenomegaly    Preferred imaging location?:   [facility]    Ferritin    Standing Status:   Future    Number of Occurrences:   1    Expected Date:   08/12/2023    Expiration Date:   11/10/2023   Folate    Standing Status:   Future    Number of Occurrences:   1    Expected Date:   08/12/2023  Expiration Date:   11/10/2023   Vitamin B12    Standing Status:   Future    Number of Occurrences:   1    Expected Date:   08/12/2023    Expiration Date:   11/10/2023   CBC with Differential/Platelet    Standing Status:   Future    Number of Occurrences:   1    Expected Date:   08/12/2023    Expiration Date:   11/10/2023   Comprehensive metabolic panel with GFR    Standing Status:   Future    Number of Occurrences:   1    Expected Date:   08/12/2023    Expiration Date:   11/10/2023   Uric acid    Standing Status:   Future    Number of Occurrences:   1    Expected Date:   08/12/2023    Expiration Date:   11/10/2023   Haptoglobin    Standing Status:   Future    Number of Occurrences:   1    Expected Date:   08/12/2023    Expiration Date:   11/10/2023   Lactate dehydrogenase    Standing Status:   Future    Number of Occurrences:   1    Expected Date:   08/12/2023    Expiration Date:   11/10/2023   Iron and TIBC    Standing Status:   Future    Number of Occurrences:   1    Expected Date:   08/12/2023    Expiration Date:   11/10/2023   Reticulocytes    Standing Status:   Future    Number of Occurrences:   1    Expected Date:   08/12/2023    Expiration Date:   11/10/2023   Retic Panel    Standing Status:   Future    Number of Occurrences:   1    Expected Date:   08/12/2023    Expiration Date:   11/10/2023   Protime-INR    Standing Status:   Future    Number of Occurrences:   1    Expected Date:   08/12/2023    Expiration Date:   11/10/2023   D-dimer, quantitative    Standing Status:   Future    Number of Occurrences:   1    Expected Date:   08/12/2023    Expiration Date:   11/10/2023   APTT    Standing Status:   Future    Number of Occurrences:   1     Expected Date:   08/12/2023    Expiration Date:   11/10/2023   Save Smear for Provider Slide Review    Standing Status:   Future    Number of Occurrences:   1    Expected Date:   08/12/2023    Expiration Date:   11/10/2023   Copper, serum    Standing Status:   Future    Number of Occurrences:   1    Expected Date:   08/12/2023    Expiration Date:   11/10/2023   Hepatitis panel, acute    Standing Status:   Future    Number of Occurrences:   1    Expected Date:   08/12/2023    Expiration Date:   08/11/2024    Release to patient:   Immediate [1]    The total time spent in the appointment was 60 minutes encounter with patients including review of chart and various tests  results, discussions about plan of care and coordination of care plan   All questions were answered. The patient knows to call the clinic with any problems, questions or concerns. No barriers to learning was detected.   [name], MD 6/27/20253:45 PM

## 2023-08-12 NOTE — Progress Notes (Signed)
 Message received from Dr. Davonna- Can we add on TSH and T4 for his labs?   Orders placed per Dr. Davonna order and spoke with Rosina in the lab and they are adding them on.

## 2023-08-12 NOTE — Progress Notes (Signed)
 CRITICAL VALUE ALERT Critical value received:  platelets 24 Date of notification:  08-12-23 Time of notification: 1040 Critical value read back:  Yes.   Nurse who received alert:  C. Cori Justus RN MD notified time and response:  1041, Dr. Davonna, no new orders.

## 2023-08-12 NOTE — Patient Instructions (Signed)
 VISIT SUMMARY:  You visited us  today due to a recent episode of fainting and a significantly low platelet count. We discussed your history of hypertension, past stent placement, and recent symptoms of fatigue and shortness of breath. We also reviewed your medication history and recent use of amoxicillin for a dental infection.  YOUR PLAN:  -THROMBOCYTOPENIA: Thrombocytopenia means having a low platelet count, which can affect blood clotting. Your platelet count was very low recently, possibly due to the amoxicillin you took. We will do blood tests to check for nutritional deficiencies and bone marrow function, and an abdominal ultrasound to check your spleen. We will repeat your platelet count today and in one week. If your platelets drop below 20, we may consider a short course of steroids. Please watch for any signs of bleeding if your platelets go below 10.   INSTRUCTIONS:  Please follow up with your primary care doctor or cardiologist to discuss your blood pressure management. We will repeat your platelet count today and in one week. Watch for any signs of bleeding, such as unusual bruising or prolonged bleeding from cuts, and seek medical attention if these occur.

## 2023-08-12 NOTE — Progress Notes (Signed)
 Called patient and he is aware and is agreeable with plan to start taking B12 1000 mcg daily and to get the B12 injections. Transferred to front schedulers to have appointments scheduled.

## 2023-08-13 ENCOUNTER — Ambulatory Visit (HOSPITAL_COMMUNITY)
Admission: RE | Admit: 2023-08-13 | Discharge: 2023-08-13 | Disposition: A | Discharge status: Home / Self Care | Source: Ambulatory Visit | Attending: Oncology | Admitting: Oncology

## 2023-08-13 ENCOUNTER — Encounter: Payer: Self-pay | Admitting: Oncology

## 2023-08-13 DIAGNOSIS — D696 Thrombocytopenia, unspecified: Secondary | ICD-10-CM | POA: Insufficient documentation

## 2023-08-13 DIAGNOSIS — N281 Cyst of kidney, acquired: Secondary | ICD-10-CM | POA: Diagnosis not present

## 2023-08-13 DIAGNOSIS — Z9049 Acquired absence of other specified parts of digestive tract: Secondary | ICD-10-CM | POA: Diagnosis not present

## 2023-08-13 LAB — HAPTOGLOBIN: Haptoglobin: 152 mg/dL (ref 38–329)

## 2023-08-13 LAB — T4: T4, Total: 7.9 ug/dL (ref 4.5–12.0)

## 2023-08-13 NOTE — Assessment & Plan Note (Signed)
 Patient had chronic thrombocytopenia for several years based on lab review at least since 2009, usual range is 100-120 Recent drop to 17 and repeat of 27, platelet count 24 today, likely secondary to recent amoxicillin use and vitamin B12 deficiency Obtained nutritional studies including folate, iron panel, ferritin, vitamin B12, copper All obtain reticulocyte count, LDH, hepatitis panel, haptoglobin to rule out other causes including TTP Peripheral smear reviewed: Red blood cells: No schistocytes seen, WBC: Normal morphology, platelets: Decreased in number, occasional big platelets seen, 1 very small clump identified  - Labs today consistent with significant B12 deficiency.  Will start parenteral vitamin B12 supplementation - Start oral vitamin B12 supplementation 1000 mcg/day - Obtain ultrasound of abdomen to rule out hepatosplenomegaly -Will repeat labs in 1 week, if does not improve can consider dose of IVIG - Recommended patient to watch out for signs of bleeding if he has any altered mental status or any significant bleeding to come to the ER  Return to clinic in 1 week with labs.

## 2023-08-14 LAB — COPPER, SERUM: Copper: 76 ug/dL (ref 69–132)

## 2023-08-18 ENCOUNTER — Other Ambulatory Visit: Payer: Self-pay

## 2023-08-18 DIAGNOSIS — D696 Thrombocytopenia, unspecified: Secondary | ICD-10-CM

## 2023-08-18 DIAGNOSIS — E538 Deficiency of other specified B group vitamins: Secondary | ICD-10-CM

## 2023-08-19 ENCOUNTER — Other Ambulatory Visit: Payer: Self-pay | Admitting: *Deleted

## 2023-08-19 ENCOUNTER — Inpatient Hospital Stay

## 2023-08-19 ENCOUNTER — Inpatient Hospital Stay (HOSPITAL_BASED_OUTPATIENT_CLINIC_OR_DEPARTMENT_OTHER): Admitting: Hematology

## 2023-08-19 ENCOUNTER — Inpatient Hospital Stay: Attending: Hematology

## 2023-08-19 VITALS — BP 205/66 | HR 58 | Temp 97.4°F | Resp 16 | Wt 161.8 lb

## 2023-08-19 DIAGNOSIS — D696 Thrombocytopenia, unspecified: Secondary | ICD-10-CM | POA: Insufficient documentation

## 2023-08-19 DIAGNOSIS — Z79899 Other long term (current) drug therapy: Secondary | ICD-10-CM | POA: Diagnosis not present

## 2023-08-19 DIAGNOSIS — E538 Deficiency of other specified B group vitamins: Secondary | ICD-10-CM

## 2023-08-19 LAB — COMPREHENSIVE METABOLIC PANEL WITH GFR
ALT: 13 U/L (ref 0–44)
AST: 22 U/L (ref 15–41)
Albumin: 3.8 g/dL (ref 3.5–5.0)
Alkaline Phosphatase: 46 U/L (ref 38–126)
Anion gap: 9 (ref 5–15)
BUN: 16 mg/dL (ref 8–23)
CO2: 27 mmol/L (ref 22–32)
Calcium: 8.6 mg/dL — ABNORMAL LOW (ref 8.9–10.3)
Chloride: 102 mmol/L (ref 98–111)
Creatinine, Ser: 1.02 mg/dL (ref 0.61–1.24)
GFR, Estimated: >60 mL/min (ref >60)
Glucose, Bld: 101 mg/dL — ABNORMAL HIGH (ref 70–99)
Potassium: 3.8 mmol/L (ref 3.5–5.1)
Sodium: 138 mmol/L (ref 135–145)
Total Bilirubin: 0.9 mg/dL (ref 0.0–1.2)
Total Protein: 7 g/dL (ref 6.5–8.1)

## 2023-08-19 LAB — CBC WITH DIFFERENTIAL/PLATELET
Abs Immature Granulocytes: 0.03 10*3/uL (ref 0.00–0.07)
Basophils Absolute: 0 10*3/uL (ref 0.0–0.1)
Basophils Relative: 0 %
Eosinophils Absolute: 0.1 10*3/uL (ref 0.0–0.5)
Eosinophils Relative: 2 %
HCT: 48 % (ref 39.0–52.0)
Hemoglobin: 15.6 g/dL (ref 13.0–17.0)
Immature Granulocytes: 1 %
Lymphocytes Relative: 27 %
Lymphs Abs: 1.5 10*3/uL (ref 0.7–4.0)
MCH: 30.1 pg (ref 26.0–34.0)
MCHC: 32.5 g/dL (ref 30.0–36.0)
MCV: 92.7 fL (ref 80.0–100.0)
Monocytes Absolute: 0.9 10*3/uL (ref 0.1–1.0)
Monocytes Relative: 15 %
Neutro Abs: 3.2 10*3/uL (ref 1.7–7.7)
Neutrophils Relative %: 55 %
Platelets: 33 10*3/uL — ABNORMAL LOW (ref 150–400)
RBC: 5.18 MIL/uL (ref 4.22–5.81)
RDW: 14.2 % (ref 11.5–15.5)
WBC: 5.8 10*3/uL (ref 4.0–10.5)
nRBC: 0 % (ref 0.0–0.2)

## 2023-08-19 LAB — IRON AND TIBC
Iron: 89 ug/dL (ref 45–182)
Saturation Ratios: 38 % (ref 17.9–39.5)
TIBC: 233 ug/dL — ABNORMAL LOW (ref 250–450)
UIBC: 144 ug/dL

## 2023-08-19 LAB — FERRITIN: Ferritin: 139 ng/mL (ref 24–336)

## 2023-08-19 LAB — VITAMIN B12: Vitamin B-12: 161 pg/mL — ABNORMAL LOW (ref 180–914)

## 2023-08-19 MED ORDER — CYANOCOBALAMIN 1000 MCG/ML IJ SOLN
1000.0000 ug | Freq: Once | INTRAMUSCULAR | Status: AC
  Administered 2023-08-19: 1000 ug via INTRAMUSCULAR
  Filled 2023-08-19: qty 1

## 2023-08-19 MED ORDER — DEXAMETHASONE 4 MG PO TABS: 40.0000 mg | ORAL_TABLET | Freq: Every day | ORAL | 0 refills | Status: DC

## 2023-08-19 MED ORDER — DEXAMETHASONE 4 MG PO TABS: 40.0000 mg | ORAL_TABLET | Freq: Two times a day (BID) | ORAL | 0 refills | Status: DC

## 2023-08-19 NOTE — Progress Notes (Signed)
 [facility] [address], [place] [address]    Clinic Day:  08/19/2023  Referring physician: Orpha Martinez LABOR, MD  Patient Care Team: [name] Martinez LABOR, MD as PCP - General (Unknown Physician Specialty) [name] Jayson MATSU, MD as PCP - Cardiology (Cardiology)   ASSESSMENT & PLAN:   Assessment:  1.  Severe thrombocytopenia: - Has history of mild thrombocytopenia since 2009. - Severe thrombocytopenia on 08/12/2023 with platelet count 24.  No bleeding issues. - Reportedly took amoxicillin for 10 days for dental infection, completed 2 to 3 weeks prior to low platelet count. - Labs on 08/12/2023: B12 low at 122.  Hemoglobin and white count normal.  Copper level was normal.  Hepatitis B and C was negative.   Plan:  1.  Severe thrombocytopenia: - Platelet count today is 33.  No bleeding issues reported. - Not entirely clear if thrombocytopenia was from amoxicillin.  He had mild thrombocytopenia since 2009.  Immune thrombocytopenia is still in the differential diagnosis. - Will give a trial of dexamethasone 40 mg p.o. daily x 4 days. - Will repeat platelet count next week.  2.  Hypertension: - Blood pressure today is elevated at 204/66.  He is asymptomatic. - He reportedly stopped taking all his blood pressure medication when he had a dizzy episode in the church.  He reports that his blood pressure when he checks at home is 170/60-90. - Recommend that he start back on amlodipine  10 mg daily and lisinopril  10 mg daily.  He is on 2 other medications including hydrochlorothiazide and metoprolol .  3.  B12 deficiency: - B12 level was low at 122.  Will give him a B12 injection 1 mg x 1 today.  He is already taking B12 1 mg tablet daily.   Orders Placed This Encounter  Procedures   CBC    Standing Status:   Future    Expected Date:   08/24/2023    Expiration Date:   08/18/2024      [name] Martinez,acting as a scribe for [name] Stands, MD.,have documented all  relevant documentation on the behalf of [name] Stands, MD,as directed by  [name] Stands, MD while in the presence of [name] Stands, MD.   I, [name] Stands MD, have reviewed the above documentation for accuracy and completeness, and I agree with the above.   [name] Stands, MD   7/3/20254:04 PM  CHIEF COMPLAINT:   Diagnosis: Thrombocytopenia  Prior Therapy: None  Current Therapy: B12 and dexamethasone   HISTORY OF PRESENT ILLNESS:   Oncology History   No history exists.     INTERVAL HISTORY:   [name] Martinez is a 89 y.o. male presenting to clinic today for follow up of thrombocytopenia. He was last seen by [name] Martinez on 08/12/23.  Since his last visit, lab workup for worsening thrombocytopenia was done. US  abdomen was also done on 08/13/23 that showed: No acute abnormality identified. No evidence of hepatosplenomegaly. Increased echotexture of the liver.  Today, he states that he is doing well overall. His appetite level is at 100%. His energy level is at 100%. He is accompanied by his wife.   [name] reports he lost consciousness on 08/08/23 at church. He stopped all 4 blood pressure medications after that. Since discontinuing these medications, he notes improved energy levels and improved SOB. His blood pressure readings at home have been a diastolic pressure from 160-175 and a systolic pressure of 60-90. He has been taking a beta-blocker for 40 years before discontinuing metoprolol .  [name] Martinez denies any ankle swellings.   He has been taking Vitamin B 12 oral supplements since [name] Martinez prescribed it. He is also taking fish oil. [name] last took amoxicillin about 3 weeks ago for gingivitis. He does not like to take steroids, but is willing to do a short course of dexamethasone.   PAST MEDICAL HISTORY:   Past Medical History: Past Medical History:  Diagnosis Date   Coronary artery disease 11/22/2007   DES to LAD 2009, mild to moderate residual disease    Essential hypertension    GERD (gastroesophageal reflux disease)    Mixed hyperlipidemia    Postherpetic neuralgia     Surgical History: No past surgical history on file.  Social History: Social History   Socioeconomic History   Marital status: Married    Spouse name: Not on file   Number of children: Not on file   Years of education: Not on file   Highest education level: Not on file  Occupational History   Occupation: retired    Comment: work in Press photographer for 30 years  Tobacco Use   Smoking status: Former    Current packs/day: 0.00    Average packs/day: 0.5 packs/day for 25.0 years (12.5 ttl pk-yrs)    Types: Cigarettes    Start date: 09/26/1956    Quit date: 02/16/1978    Years since quitting: 45.5   Smokeless tobacco: Never  Vaping Use   Vaping status: Never Used  Substance and Sexual Activity   Alcohol use: No    Alcohol/week: 0.0 standard drinks of alcohol   Drug use: No   Sexual activity: Not on file  Other Topics Concern   Not on file  Social History Narrative   Not on file   Social Drivers of Health   Financial Resource Strain: Low Risk  (08/12/2023)   Overall Financial Resource Strain (CARDIA)    Difficulty of Paying Living Expenses: Not hard at all  Food Insecurity: No Food Insecurity (08/12/2023)   Hunger Vital Sign    Worried About Running Out of Food in the Last Year: Never true    Ran Out of Food in the Last Year: Never true  Transportation Needs: No Transportation Needs (08/12/2023)   PRAPARE - Administrator, Civil Service (Medical): No    Lack of Transportation (Non-Medical): No  Physical Activity: Inactive (08/12/2023)   Exercise Vital Sign    Days of Exercise per Week: 0 days    Minutes of Exercise per Session: 0 min  Stress: No Stress Concern Present (08/12/2023)   [employer] of Occupational Health - Occupational Stress Questionnaire    Feeling of Stress: Not at all  Social Connections: Moderately Integrated (08/12/2023)    Social Connection and Isolation Panel    Frequency of Communication with Friends and Family: Once a week    Frequency of Social Gatherings with Friends and Family: Once a week    Attends Religious Services: More than 4 times per year    Active Member of [employer] or Organizations: Yes    Attends Engineer, structural: More than 4 times per year    Marital Status: Married  Catering manager Violence: Not At Risk (08/12/2023)   Humiliation, Afraid, Rape, and Kick questionnaire    Fear of Current or Ex-Partner: No    Emotionally Abused: No    Physically Abused: No    Sexually Abused: No    Family History: Family History  Problem Relation Age of Onset   Heart attack  Sister    Coronary artery disease Brother        CABG    Current Medications:  Current Outpatient Medications:    allopurinol (ZYLOPRIM) 300 MG tablet, Take 1 tablet by mouth daily. (Patient not taking: Reported on 08/19/2023), Disp: , Rfl:    amLODipine  (NORVASC ) 10 MG tablet, TAKE 1 TABLET BY MOUTH EVERY DAY (Patient not taking: Reported on 08/19/2023), Disp: 90 tablet, Rfl: 1   aspirin 81 MG tablet, Take 81 mg by mouth daily. (Patient not taking: Reported on 08/19/2023), Disp: , Rfl:    chlorthalidone  (HYGROTON ) 25 MG tablet, TAKE 1 TABLET BY MOUTH EVERY DAY (Patient not taking: Reported on 08/19/2023), Disp: 90 tablet, Rfl: 1   cholecalciferol (VITAMIN D) 1000 UNITS tablet, Take 2,000 Units by mouth daily. (Patient not taking: Reported on 08/19/2023), Disp: , Rfl:    cyanocobalamin (VITAMIN B12) 1000 MCG tablet, Take 1 tablet (1,000 mcg total) by mouth daily. (Patient not taking: Reported on 08/19/2023), Disp: 90 tablet, Rfl: 2   dexamethasone (DECADRON) 4 MG tablet, Take 10 tablets (40 mg total) by mouth daily., Disp: 40 tablet, Rfl: 0   isosorbide mononitrate (IMDUR) 60 MG 24 hr tablet, Take 60 mg by mouth daily. (Patient not taking: Reported on 08/19/2023), Disp: , Rfl:    lisinopril  (ZESTRIL ) 10 MG tablet, TAKE 1 TABLET BY MOUTH  EVERY DAY (Patient not taking: Reported on 08/19/2023), Disp: 90 tablet, Rfl: 1   meloxicam (MOBIC) 7.5 MG tablet, Take 1 tablet by mouth daily. (Patient not taking: Reported on 08/19/2023), Disp: , Rfl:    metoprolol  tartrate (LOPRESSOR ) 50 MG tablet, TAKE 1 TABLET BY MOUTH TWICE DAILY (Patient not taking: Reported on 08/19/2023), Disp: 180 tablet, Rfl: 1   nitroGLYCERIN  (NITROSTAT ) 0.4 MG SL tablet, Place 1 tablet (0.4 mg total) under the tongue every 5 (five) minutes as needed. (Patient not taking: Reported on 08/19/2023), Disp: 25 tablet, Rfl: 3   potassium chloride SA (KLOR-CON M) 20 MEQ tablet, Take 20 mEq by mouth daily. (Patient not taking: Reported on 08/19/2023), Disp: , Rfl:    pravastatin  (PRAVACHOL ) 40 MG tablet, TAKE 1 TABLET BY MOUTH EVERY EVENING (Patient not taking: Reported on 08/19/2023), Disp: 90 tablet, Rfl: 1   SHINGRIX injection, Inject 0.5 mLs into the muscle once. (Patient not taking: Reported on 08/19/2023), Disp: , Rfl:    terazosin  (HYTRIN ) 5 MG capsule, Take 5 mg by mouth at bedtime.   (Patient not taking: Reported on 08/19/2023), Disp: , Rfl:    Allergies: No Known Allergies  REVIEW OF SYSTEMS:   Review of Systems  Constitutional:  Negative for chills, fatigue and fever.  HENT:   Negative for lump/mass, mouth sores, nosebleeds, sore throat and trouble swallowing.   Eyes:  Negative for eye problems.  Respiratory:  Positive for cough. Negative for shortness of breath.   Cardiovascular:  Negative for chest pain, leg swelling and palpitations.  Gastrointestinal:  Negative for abdominal pain, constipation, diarrhea, nausea and vomiting.  Genitourinary:  Negative for bladder incontinence, difficulty urinating, dysuria, frequency, hematuria and nocturia.   Musculoskeletal:  Negative for arthralgias, back pain, flank pain, myalgias and neck pain.  Skin:  Negative for itching and rash.  Neurological:  Negative for dizziness, headaches and numbness.  Hematological:  Does not bruise/bleed  easily.  Psychiatric/Behavioral:  Negative for depression, sleep disturbance and suicidal ideas. The patient is not nervous/anxious.   All other systems reviewed and are negative.    VITALS:   Blood pressure (!) 205/66, pulse ROLLEN)  58, temperature (!) 97.4 F (36.3 C), temperature source Oral, resp. rate 16, weight 161 lb 13.1 oz (73.4 kg), SpO2 94%.  Wt Readings from Last 3 Encounters:  08/19/23 161 lb 13.1 oz (73.4 kg)  08/12/23 162 lb 7.7 oz (73.7 kg)  07/02/22 177 lb 6.4 oz (80.5 kg)    Body mass index is 22.57 kg/m.  Performance status (ECOG): 1 - Symptomatic but completely ambulatory  PHYSICAL EXAM:   Physical Exam Vitals and nursing note reviewed. Exam conducted with a chaperone present.  Constitutional:      Appearance: Normal appearance.  Cardiovascular:     Rate and Rhythm: Normal rate and regular rhythm.     Pulses: Normal pulses.     Heart sounds: Normal heart sounds.  Pulmonary:     Effort: Pulmonary effort is normal.     Breath sounds: Normal breath sounds.  Abdominal:     Palpations: Abdomen is soft. There is no hepatomegaly, splenomegaly or mass.     Tenderness: There is no abdominal tenderness.  Musculoskeletal:     Right lower leg: No edema.     Left lower leg: No edema.  Lymphadenopathy:     Cervical: No cervical adenopathy.     Right cervical: No superficial, deep or posterior cervical adenopathy.    Left cervical: No superficial, deep or posterior cervical adenopathy.     Upper Body:     Right upper body: No supraclavicular or axillary adenopathy.     Left upper body: No supraclavicular or axillary adenopathy.  Neurological:     General: No focal deficit present.     Mental Status: He is alert and oriented to person, place, and time.  Psychiatric:        Mood and Affect: Mood normal.        Behavior: Behavior normal.     LABS:   CBC     Component Value Date/Time   WBC 5.8 08/19/2023 0809   RBC 5.18 08/19/2023 0809   HGB 15.6 08/19/2023  0809   HCT 48.0 08/19/2023 0809   PLT 33 (L) 08/19/2023 0809   MCV 92.7 08/19/2023 0809   MCH 30.1 08/19/2023 0809   MCHC 32.5 08/19/2023 0809   RDW 14.2 08/19/2023 0809   LYMPHSABS 1.5 08/19/2023 0809   MONOABS 0.9 08/19/2023 0809   EOSABS 0.1 08/19/2023 0809   BASOSABS 0.0 08/19/2023 0809    CMP      Component Value Date/Time   NA 138 08/19/2023 0809   K 3.8 08/19/2023 0809   CL 102 08/19/2023 0809   CO2 27 08/19/2023 0809   GLUCOSE 101 (H) 08/19/2023 0809   BUN 16 08/19/2023 0809   CREATININE 1.02 08/19/2023 0809   CALCIUM 8.6 (L) 08/19/2023 0809   PROT 7.0 08/19/2023 0809   ALBUMIN 3.8 08/19/2023 0809   AST 22 08/19/2023 0809   ALT 13 08/19/2023 0809   ALKPHOS 46 08/19/2023 0809   BILITOT 0.9 08/19/2023 0809   GFRNONAA >60 08/19/2023 0809   GFRAA  11/22/2007 0325    >60        The eGFR has been calculated using the MDRD equation. This calculation has not been validated in all clinical     No results found for: CEA1, CEA / No results found for: CEA1, CEA No results found for: PSA1 No results found for: CAN199 No results found for: CAN125  No results found for: TOTALPROTELP, ALBUMINELP, A1GS, A2GS, BETS, BETA2SER, GAMS, MSPIKE, SPEI Lab Results  Component Value Date  TIBC 233 (L) 08/19/2023   TIBC 257 08/12/2023   FERRITIN 139 08/19/2023   FERRITIN 169 08/12/2023   IRONPCTSAT 38 08/19/2023   IRONPCTSAT 45 (H) 08/12/2023   Lab Results  Component Value Date   LDH 167 08/12/2023     STUDIES:   US  Abdomen Complete Result Date: 08/13/2023 CLINICAL DATA:  Thrombocytopenia.  History of a paddle splenomegaly. EXAM: ABDOMEN ULTRASOUND COMPLETE COMPARISON:  None Available. FINDINGS: Gallbladder: Surgically removed. Common bile duct: Diameter: 4.9 mm. Liver: No focal lesion identified. Liver measures 15.2 cm. Increased echotexture. Portal vein is patent on color Doppler imaging with normal direction of blood flow towards the  liver. IVC: No abnormality visualized. Pancreas: Not well seen due to overlying bowel gas. Spleen: Size and appearance within normal limits. The spleen measures 10.5 cm. Right Kidney: Length: 10 cm. Echogenicity within normal limits. No hydronephrosis visualized. There is a 1.2 x 1.1 x 1.1 cm simple cyst in the right kidney. No follow-up is recommended. Left Kidney: Length: 10.8 cm. Echogenicity within normal limits. No hydronephrosis visualized. There are simple cysts within the left kidney, largest measures 2.2 x 1.8 x 2.1 cm. No follow-up is recommended. Abdominal aorta: No aneurysm visualized. Other findings: None. IMPRESSION: 1. No acute abnormality identified. No evidence a hepatosplenomegaly. 2. Increased echotexture of the liver. This is a nonspecific finding but can be seen in fatty infiltration of liver. Electronically Signed   By: Craig Farr M.D.   On: 08/13/2023 10:30

## 2023-08-19 NOTE — Patient Instructions (Signed)
 Vitamin B12 Injection What is this medication? Vitamin B12 (VAHY tuh min B12) prevents and treats low vitamin B12 levels in your body. It is used in people who do not get enough vitamin B12 from their diet or when their digestive tract does not absorb enough. Vitamin B12 plays an important role in maintaining the health of your nervous system and red blood cells. This medicine may be used for other purposes; ask your health care provider or pharmacist if you have questions. COMMON BRAND NAME(S): B-12 Compliance Kit, B-12 Injection Kit, Cyomin, Dodex, LA-12, Nutri-Twelve, Physicians EZ Use B-12, Primabalt, Vitamin Deficiency Injectable System - B12 What should I tell my care team before I take this medication? They need to know if you have any of these conditions: Kidney disease Leber's disease Megaloblastic anemia An unusual or allergic reaction to cyanocobalamin, cobalt, other medications, foods, dyes, or preservatives Pregnant or trying to get pregnant Breast-feeding How should I use this medication? This medication is injected into a muscle or deeply under the skin. It is usually given in a clinic or care team's office. However, your care team may teach you how to inject yourself. Follow all instructions. Talk to your care team about the use of this medication in children. Special care may be needed. Overdosage: If you think you have taken too much of this medicine contact a poison control center or emergency room at once. NOTE: This medicine is only for you. Do not share this medicine with others. What if I miss a dose? If you are given your dose at a clinic or care team's office, call to reschedule your appointment. If you give your own injections, and you miss a dose, take it as soon as you can. If it is almost time for your next dose, take only that dose. Do not take double or extra doses. What may interact with this medication? Alcohol Colchicine This list may not describe all possible  interactions. Give your health care provider a list of all the medicines, herbs, non-prescription drugs, or dietary supplements you use. Also tell them if you smoke, drink alcohol, or use illegal drugs. Some items may interact with your medicine. What should I watch for while using this medication? Visit your care team regularly. You may need blood work done while you are taking this medication. You may need to follow a special diet. Talk to your care team. Limit your alcohol intake and avoid smoking to get the best benefit. What side effects may I notice from receiving this medication? Side effects that you should report to your care team as soon as possible: Allergic reactions--skin rash, itching, hives, swelling of the face, lips, tongue, or throat Swelling of the ankles, hands, or feet Trouble breathing Side effects that usually do not require medical attention (report to your care team if they continue or are bothersome): Diarrhea This list may not describe all possible side effects. Call your doctor for medical advice about side effects. You may report side effects to FDA at 1-800-FDA-1088. Where should I keep my medication? Keep out of the reach of children. Store at room temperature between 15 and 30 degrees C (59 and 85 degrees F). Protect from light. Throw away any unused medication after the expiration date. NOTE: This sheet is a summary. It may not cover all possible information. If you have questions about this medicine, talk to your doctor, pharmacist, or health care provider.  2024 Elsevier/Gold Standard (2020-10-15 00:00:00)

## 2023-08-19 NOTE — Patient Instructions (Addendum)
 [facility] at [facility] Discharge Instructions   You were seen and examined today by Dr. Rogers.  He reviewed the results of your lab work which are mostly normal/stable. Your B12 was low. We will give you a shot of B12 today. Continue taking B12 tablets daily.   We will see you back next week. We will check labs at that time.    Return as scheduled.    Thank you for choosing [facility] at [facility] to provide your oncology and hematology care.  To afford each patient quality time with our provider, please arrive at least 15 minutes before your scheduled appointment time.   If you have a lab appointment with the Cancer Center please come in thru the Main Entrance and check in at the main information desk.  You need to re-schedule your appointment should you arrive 10 or more minutes late.  We strive to give you quality time with our providers, and arriving late affects you and other patients whose appointments are after yours.  Also, if you no show three or more times for appointments you may be dismissed from the clinic at the providers discretion.     Again, thank you for choosing [facility].  Our hope is that these requests will decrease the amount of time that you wait before being seen by our physicians.       _____________________________________________________________  Should you have questions after your visit to [facility], please contact our office at [phone] and follow the prompts.  Our office hours are 8:00 a.m. and 4:30 p.m. Monday - Friday.  Please note that voicemails left after 4:00 p.m. may not be returned until the following business day.  We are closed weekends and major holidays.  You do have access to a nurse 24-7, just call the main number to the clinic [phone] and do not press any options, hold on the line and a nurse will answer the phone.    For prescription refill requests,  have your pharmacy contact our office and allow 72 hours.    Due to Covid, you will need to wear a mask upon entering the hospital. If you do not have a mask, a mask will be given to you at the Main Entrance upon arrival. For doctor visits, patients may have 1 support person age 26 or older with them. For treatment visits, patients can not have anyone with them due to social distancing guidelines and our immunocompromised population.

## 2023-08-19 NOTE — Progress Notes (Signed)
 Patient tolerated B12 injection with no complaints voiced.  Site clean and dry with no bruising or swelling noted at site.  See MAR for details.  Band aid applied.  Patient stable during and after injection.  Vss with discharge and left in satisfactory condition with no s/s of distress noted. All follow ups as scheduled.   [name] [employer]

## 2023-08-24 ENCOUNTER — Inpatient Hospital Stay (HOSPITAL_BASED_OUTPATIENT_CLINIC_OR_DEPARTMENT_OTHER): Admitting: Oncology

## 2023-08-24 ENCOUNTER — Inpatient Hospital Stay

## 2023-08-24 VITALS — BP 163/83 | HR 76 | Temp 97.5°F | Resp 18 | Ht 70.0 in | Wt 165.0 lb

## 2023-08-24 DIAGNOSIS — D696 Thrombocytopenia, unspecified: Secondary | ICD-10-CM | POA: Diagnosis not present

## 2023-08-24 DIAGNOSIS — E538 Deficiency of other specified B group vitamins: Secondary | ICD-10-CM | POA: Diagnosis not present

## 2023-08-24 DIAGNOSIS — D72829 Elevated white blood cell count, unspecified: Secondary | ICD-10-CM | POA: Diagnosis not present

## 2023-08-24 DIAGNOSIS — T50905A Adverse effect of unspecified drugs, medicaments and biological substances, initial encounter: Secondary | ICD-10-CM | POA: Diagnosis not present

## 2023-08-24 DIAGNOSIS — Z79899 Other long term (current) drug therapy: Secondary | ICD-10-CM | POA: Diagnosis not present

## 2023-08-24 DIAGNOSIS — D6959 Other secondary thrombocytopenia: Secondary | ICD-10-CM

## 2023-08-24 LAB — CBC
HCT: 48.2 % (ref 39.0–52.0)
Hemoglobin: 16.1 g/dL (ref 13.0–17.0)
MCH: 30.9 pg (ref 26.0–34.0)
MCHC: 33.4 g/dL (ref 30.0–36.0)
MCV: 92.5 fL (ref 80.0–100.0)
Platelets: 56 K/uL — ABNORMAL LOW (ref 150–400)
RBC: 5.21 MIL/uL (ref 4.22–5.81)
RDW: 14.1 % (ref 11.5–15.5)
WBC: 14.7 K/uL — ABNORMAL HIGH (ref 4.0–10.5)
nRBC: 0 % (ref 0.0–0.2)

## 2023-08-24 NOTE — Assessment & Plan Note (Addendum)
 Patient had chronic thrombocytopenia for several years based on lab review at least since 2009, usual range is 100-120 Likely secondary to recent amoxicillin use and vitamin B12 deficiency LDH and haptoglobin normal.  Normal iron and copper  level Hepatitis B&C negative Peripheral smear with no schistocytes and occasional small platelet clumps and big platelets Ultrasound abdomen showed no evidence of hepatosplenomegaly Patient did a trial of 4 days of dexamethasone   - Patient has some improvement in platelets with a level of 56 today - Continue parenteral and oral vitamin B12 supplementation - Continue to monitor counts at this time  Return to clinic in 10 days with labs

## 2023-08-24 NOTE — Assessment & Plan Note (Signed)
 Patient has significant vitamin B12 deficiency with a level of less than 200 Started on oral and parenteral vitamin B12 supplementation  - Continue oral and parenteral vitamin B12 supplementation

## 2023-08-24 NOTE — Assessment & Plan Note (Signed)
 Likely secondary to steroid use No B symptoms  - Continue to monitor counts

## 2023-08-24 NOTE — Progress Notes (Signed)
 [facility] at [facility]  HEMATOLOGY FOLLOW-UP VISIT  [name] LABOR, MD  REASON FOR FOLLOW-UP: Thrombocytopenia  ASSESSMENT & PLAN:  Patient is a 89 y.o. male following for thrombocytopenia  Thrombocytopenia (HCC) Patient had chronic thrombocytopenia for several years based on lab review at least since 2009, usual range is 100-120 Likely secondary to recent amoxicillin use and vitamin B12 deficiency LDH and haptoglobin normal.  Normal iron and copper  level Hepatitis B&C negative Peripheral smear with no schistocytes and occasional small platelet clumps and big platelets Ultrasound abdomen showed no evidence of hepatosplenomegaly Patient did a trial of 4 days of dexamethasone   - Patient has some improvement in platelets with a level of 56 today - Continue parenteral and oral vitamin B12 supplementation - Continue to monitor counts at this time  Return to clinic in 10 days with labs  Vitamin B12 deficiency Patient has significant vitamin B12 deficiency with a level of less than 200 Started on oral and parenteral vitamin B12 supplementation  - Continue oral and parenteral vitamin B12 supplementation  Leukocytosis Likely secondary to steroid use No B symptoms  - Continue to monitor counts   Orders Placed This Encounter  Procedures   CBC with Differential/Platelet    Standing Status:   Future    Expected Date:   09/03/2023    Expiration Date:   12/02/2023    The total time spent in the appointment was 20 minutes encounter with patients including review of chart and various tests results, discussions about plan of care and coordination of care plan   All questions were answered. The patient knows to call the clinic with any problems, questions or concerns. No barriers to learning was detected.  [name], MD 7/8/20252:28 PM   SUMMARY OF HEMATOLOGIC HISTORY: Chronic thrombocytopenia with a platelet range of 100-120 at least since  2009 Recent drop to 17 Labs consistent with severe vitamin B12 deficiency Ultrasound abdomen showed no hepatosplenomegaly   INTERVAL HISTORY: [name] 89 y.o. male following for thrombocytopenia. Patient was accompanied by his wife today.  He reports feeling significantly better since the last visit.  Reports that he has a lot of energy and has been able to clean his pool and mow his lawn.  He has no complaints today.  Denies bleeding or bruising.   I have reviewed the past medical history, past surgical history, social history and family history with the patient   ALLERGIES:  has no known allergies.  MEDICATIONS:  Current Outpatient Medications  Medication Sig Dispense Refill   allopurinol (ZYLOPRIM) 300 MG tablet Take 1 tablet by mouth daily.     amLODipine  (NORVASC ) 10 MG tablet TAKE 1 TABLET BY MOUTH EVERY DAY 90 tablet 1   aspirin 81 MG tablet Take 81 mg by mouth daily.     cholecalciferol (VITAMIN D) 1000 UNITS tablet Take 2,000 Units by mouth daily.     cyanocobalamin  (VITAMIN B12) 1000 MCG tablet Take 1 tablet (1,000 mcg total) by mouth daily. 90 tablet 2   dexamethasone  (DECADRON ) 4 MG tablet Take 10 tablets (40 mg total) by mouth daily. 40 tablet 0   isosorbide mononitrate (IMDUR) 60 MG 24 hr tablet Take 60 mg by mouth daily.     lisinopril  (ZESTRIL ) 10 MG tablet TAKE 1 TABLET BY MOUTH EVERY DAY 90 tablet 1   meloxicam (MOBIC) 7.5 MG tablet Take 1 tablet by mouth daily.     nitroGLYCERIN  (NITROSTAT ) 0.4 MG SL tablet Place 1 tablet (  0.4 mg total) under the tongue every 5 (five) minutes as needed. 25 tablet 3   potassium chloride SA (KLOR-CON M) 20 MEQ tablet Take 20 mEq by mouth daily.     pravastatin  (PRAVACHOL ) 40 MG tablet TAKE 1 TABLET BY MOUTH EVERY EVENING 90 tablet 1   SHINGRIX injection Inject 0.5 mLs into the muscle once.     No current facility-administered medications for this visit.     REVIEW OF SYSTEMS:   Constitutional: Denies fevers, chills  or night sweats Eyes: Denies blurriness of vision Ears, nose, mouth, throat, and face: Denies mucositis or sore throat Respiratory: Denies cough, dyspnea or wheezes Cardiovascular: Denies palpitation, chest discomfort or lower extremity swelling Gastrointestinal:  Denies nausea, heartburn or change in bowel habits Skin: Denies abnormal skin rashes Lymphatics: Denies new lymphadenopathy or easy bruising Neurological:Denies numbness, tingling or new weaknesses Behavioral/Psych: Mood is stable, no new changes  All other systems were reviewed with the patient and are negative.  PHYSICAL EXAMINATION:   Vitals:   08/24/23 1037  BP: (!) 163/83  Pulse: 76  Resp: 18  Temp: (!) 97.5 F (36.4 C)  SpO2: 97%    GENERAL:alert, no distress and comfortable SKIN: skin color, texture, turgor are normal, no rashes or significant lesions LUNGS: clear to auscultation and percussion with normal breathing effort HEART: regular rate & rhythm and no murmurs and no lower extremity edema ABDOMEN:abdomen soft, non-tender and normal bowel sounds Musculoskeletal:no cyanosis of digits and no clubbing  NEURO: alert & oriented x 3 with fluent speech  LABORATORY DATA:  I have reviewed the data as listed  Lab Results  Component Value Date   WBC 14.7 (H) 08/24/2023   NEUTROABS 3.2 08/19/2023   HGB 16.1 08/24/2023   HCT 48.2 08/24/2023   MCV 92.5 08/24/2023   PLT 56 (L) 08/24/2023      Chemistry      Component Value Date/Time   NA 138 08/19/2023 0809   K 3.8 08/19/2023 0809   CL 102 08/19/2023 0809   CO2 27 08/19/2023 0809   BUN 16 08/19/2023 0809   CREATININE 1.02 08/19/2023 0809      Component Value Date/Time   CALCIUM 8.6 (L) 08/19/2023 0809   ALKPHOS 46 08/19/2023 0809   AST 22 08/19/2023 0809   ALT 13 08/19/2023 0809   BILITOT 0.9 08/19/2023 0809       Latest Reference Range & Units 08/19/23 08:09  Iron 45 - 182 ug/dL 89  UIBC ug/dL 855  TIBC 749 - 549 ug/dL 766 (L)  Saturation  Ratios 17.9 - 39.5 % 38  Ferritin 24 - 336 ng/mL 139  Vitamin B12 180 - 914 pg/mL 161 (L)  (L): Data is abnormally low  RADIOGRAPHIC STUDIES: I have personally reviewed the radiological images as listed and agreed with the findings in the report.  US  Abdomen Complete CLINICAL DATA:  Thrombocytopenia.  History of a paddle splenomegaly.  EXAM: ABDOMEN ULTRASOUND COMPLETE  COMPARISON:  None Available.  FINDINGS: Gallbladder: Surgically removed.  Common bile duct: Diameter: 4.9 mm.  Liver: No focal lesion identified. Liver measures 15.2 cm. Increased echotexture. Portal vein is patent on color Doppler imaging with normal direction of blood flow towards the liver.  IVC: No abnormality visualized.  Pancreas: Not well seen due to overlying bowel gas.  Spleen: Size and appearance within normal limits. The spleen measures 10.5 cm.  Right Kidney: Length: 10 cm. Echogenicity within normal limits. No hydronephrosis visualized. There is a 1.2 x 1.1 x 1.1  cm simple cyst in the right kidney. No follow-up is recommended.  Left Kidney: Length: 10.8 cm. Echogenicity within normal limits. No hydronephrosis visualized. There are simple cysts within the left kidney, largest measures 2.2 x 1.8 x 2.1 cm. No follow-up is recommended.  Abdominal aorta: No aneurysm visualized.  Other findings: None.  IMPRESSION: 1. No acute abnormality identified. No evidence a hepatosplenomegaly. 2. Increased echotexture of the liver. This is a nonspecific finding but can be seen in fatty infiltration of liver.  Electronically Signed   By: Craig Farr M.D.   On: 08/13/2023 10:30

## 2023-08-26 ENCOUNTER — Inpatient Hospital Stay

## 2023-08-26 VITALS — BP 155/83 | HR 81 | Temp 96.9°F | Resp 18

## 2023-08-26 DIAGNOSIS — Z79899 Other long term (current) drug therapy: Secondary | ICD-10-CM | POA: Diagnosis not present

## 2023-08-26 DIAGNOSIS — E538 Deficiency of other specified B group vitamins: Secondary | ICD-10-CM

## 2023-08-26 DIAGNOSIS — D696 Thrombocytopenia, unspecified: Secondary | ICD-10-CM | POA: Diagnosis not present

## 2023-08-26 MED ORDER — CYANOCOBALAMIN 1000 MCG/ML IJ SOLN
1000.0000 ug | Freq: Once | INTRAMUSCULAR | Status: AC
  Administered 2023-08-26: 1000 ug via INTRAMUSCULAR
  Filled 2023-08-26: qty 1

## 2023-08-26 NOTE — Progress Notes (Signed)
 [name] Sek presents today for B12 injection per the provider's orders.  Stable during administration without incident; injection site WNL; see MAR for injection details.  Patient tolerated procedure well and without incident.  No questions or complaints noted at this time.

## 2023-08-26 NOTE — Patient Instructions (Signed)

## 2023-09-02 ENCOUNTER — Inpatient Hospital Stay

## 2023-09-03 ENCOUNTER — Inpatient Hospital Stay

## 2023-09-03 ENCOUNTER — Inpatient Hospital Stay (HOSPITAL_BASED_OUTPATIENT_CLINIC_OR_DEPARTMENT_OTHER): Admitting: Oncology

## 2023-09-03 VITALS — BP 137/70

## 2023-09-03 VITALS — BP 152/69 | HR 75 | Temp 97.6°F | Resp 20 | Wt 160.3 lb

## 2023-09-03 DIAGNOSIS — D696 Thrombocytopenia, unspecified: Secondary | ICD-10-CM | POA: Diagnosis not present

## 2023-09-03 DIAGNOSIS — E538 Deficiency of other specified B group vitamins: Secondary | ICD-10-CM

## 2023-09-03 DIAGNOSIS — Z79899 Other long term (current) drug therapy: Secondary | ICD-10-CM | POA: Diagnosis not present

## 2023-09-03 DIAGNOSIS — T50905A Adverse effect of unspecified drugs, medicaments and biological substances, initial encounter: Secondary | ICD-10-CM

## 2023-09-03 LAB — CBC WITH DIFFERENTIAL/PLATELET
Abs Immature Granulocytes: 0.04 K/uL (ref 0.00–0.07)
Basophils Absolute: 0.1 K/uL (ref 0.0–0.1)
Basophils Relative: 1 %
Eosinophils Absolute: 0.2 K/uL (ref 0.0–0.5)
Eosinophils Relative: 2 %
HCT: 49.4 % (ref 39.0–52.0)
Hemoglobin: 16.5 g/dL (ref 13.0–17.0)
Immature Granulocytes: 1 %
Lymphocytes Relative: 19 %
Lymphs Abs: 1.6 K/uL (ref 0.7–4.0)
MCH: 31.3 pg (ref 26.0–34.0)
MCHC: 33.4 g/dL (ref 30.0–36.0)
MCV: 93.7 fL (ref 80.0–100.0)
Monocytes Absolute: 1.3 K/uL — ABNORMAL HIGH (ref 0.1–1.0)
Monocytes Relative: 15 %
Neutro Abs: 5.4 K/uL (ref 1.7–7.7)
Neutrophils Relative %: 62 %
Platelets: 36 K/uL — ABNORMAL LOW (ref 150–400)
RBC: 5.27 MIL/uL (ref 4.22–5.81)
RDW: 14.5 % (ref 11.5–15.5)
WBC: 8.6 K/uL (ref 4.0–10.5)
nRBC: 0 % (ref 0.0–0.2)

## 2023-09-03 MED ORDER — CYANOCOBALAMIN 1000 MCG/ML IJ SOLN
1000.0000 ug | Freq: Once | INTRAMUSCULAR | Status: AC
  Administered 2023-09-03: 1000 ug via INTRAMUSCULAR
  Filled 2023-09-03: qty 1

## 2023-09-03 NOTE — Assessment & Plan Note (Addendum)
 Patient has significant vitamin B12 deficiency with a level of less than 200 Started on oral and parenteral vitamin B12 supplementation  - Continue oral and parenteral vitamin B12 supplementation

## 2023-09-03 NOTE — Patient Instructions (Addendum)
 VISIT SUMMARY:  Today, we discussed your ongoing treatment for low platelet count and vitamin B12 deficiency. Your energy levels have improved with the B12 injections and supplements, and you are feeling fantastic. We also talked about your broken tooth and the need to delay dental work until your platelet count improves.  YOUR PLAN:  -THROMBOCYTOPENIA: Thrombocytopenia means having a low platelet count, which can increase the risk of bleeding. Your platelet count is currently at 36, likely due to your vitamin B12 deficiency. We will monitor your platelet count in two weeks and again in one month. You should avoid dental procedures for now due to the bleeding risk. If there is no improvement in your platelet count, we may consider a bone marrow biopsy.  -VITAMIN B12 DEFICIENCY: Vitamin B12 deficiency can lead to low platelet count and other issues. You have been receiving B12 injections and taking daily oral supplements, which have improved your energy levels. We will give you one more B12 injection next week and you should continue with the daily oral B12 supplements.  INSTRUCTIONS:  Please return in two weeks for a follow-up to monitor your platelet count. We will repeat the test in one month. Avoid any dental procedures until your platelet count improves. Continue with your daily oral B12 supplements and come in next week for another B12 injection.

## 2023-09-03 NOTE — Progress Notes (Signed)
 [name] presents today for follow up with Dr. Davonna and injection per the provider's orders.  B12 administration without incident; injection site WNL; see MAR for injection details. Discharged from clinic ambulatory in stable condition. Alert and oriented x 3. F/U with [facility] as scheduled.

## 2023-09-03 NOTE — Assessment & Plan Note (Addendum)
 Patient had chronic thrombocytopenia for several years based on lab review at least since 2009, usual range is 100-120 Likely secondary to recent amoxicillin use and vitamin B12 deficiency LDH and haptoglobin normal.  Normal iron and copper  level Hepatitis B&C negative Peripheral smear with no schistocytes and occasional small platelet clumps and big platelets Ultrasound abdomen showed no evidence of hepatosplenomegaly Patient did a trial of 4 days of dexamethasone  with no significant improvement No increased bleeding or bruising  - Platelets more or less stable at 36 - Continue parenteral and oral vitamin B12 supplementation - Continue to monitor counts at this time - Recommended to avoid dental procedures at this time for the risk of bleeding  Return to clinic in 2 weeks for labs and a visit in 4 weeks with labs

## 2023-09-03 NOTE — Patient Instructions (Signed)
 [facility] - A DEPT OF [facility]  Discharge Instructions: Thank you for choosing [facility] to provide your oncology and hematology care.  If you have a lab appointment with the Cancer Center - please note that after April 8th, 2024, all labs will be drawn in the cancer center.  You do not have to check in or register with the main entrance as you have in the past but will complete your check-in in the cancer center.  Wear comfortable clothing and clothing appropriate for easy access to any Portacath or PICC line.   We strive to give you quality time with your provider. You may need to reschedule your appointment if you arrive late (15 or more minutes).  Arriving late affects you and other patients whose appointments are after yours.  Also, if you miss three or more appointments without notifying the office, you may be dismissed from the clinic at the provider's discretion.      For prescription refill requests, have your pharmacy contact our office and allow 72 hours for refills to be completed.    Today you received the following chemotherapy and/or immunotherapy agents Vitamin B12 injection.  Vitamin B12 Injection What is this medication? Vitamin B12 (VAHY tuh min B12) prevents and treats low vitamin B12 levels in your body. It is used in people who do not get enough vitamin B12 from their diet or when their digestive tract does not absorb enough. Vitamin B12 plays an important role in maintaining the health of your nervous system and red blood cells. This medicine may be used for other purposes; ask your health care provider or pharmacist if you have questions. COMMON BRAND NAME(S): B-12 Compliance Kit, B-12 Injection Kit, Cyomin, Dodex, LA-12, Nutri-Twelve, Physicians EZ Use B-12, Primabalt, Vitamin Deficiency Injectable System - B12 What should I tell my care team before I take this medication? They need to know if you have any of these  conditions: Kidney disease Leber's disease Megaloblastic anemia An unusual or allergic reaction to cyanocobalamin, cobalt, other medications, foods, dyes, or preservatives Pregnant or trying to get pregnant Breast-feeding How should I use this medication? This medication is injected into a muscle or deeply under the skin. It is usually given in a clinic or care team's office. However, your care team may teach you how to inject yourself. Follow all instructions. Talk to your care team about the use of this medication in children. Special care may be needed. Overdosage: If you think you have taken too much of this medicine contact a poison control center or emergency room at once. NOTE: This medicine is only for you. Do not share this medicine with others. What if I miss a dose? If you are given your dose at a clinic or care team's office, call to reschedule your appointment. If you give your own injections, and you miss a dose, take it as soon as you can. If it is almost time for your next dose, take only that dose. Do not take double or extra doses. What may interact with this medication? Alcohol Colchicine This list may not describe all possible interactions. Give your health care provider a list of all the medicines, herbs, non-prescription drugs, or dietary supplements you use. Also tell them if you smoke, drink alcohol, or use illegal drugs. Some items may interact with your medicine. What should I watch for while using this medication? Visit your care team regularly. You may need blood work  done while you are taking this medication. You may need to follow a special diet. Talk to your care team. Limit your alcohol intake and avoid smoking to get the best benefit. What side effects may I notice from receiving this medication? Side effects that you should report to your care team as soon as possible: Allergic reactions--skin rash, itching, hives, swelling of the face, lips, tongue, or  throat Swelling of the ankles, hands, or feet Trouble breathing Side effects that usually do not require medical attention (report to your care team if they continue or are bothersome): Diarrhea This list may not describe all possible side effects. Call your doctor for medical advice about side effects. You may report side effects to FDA at 1-800-FDA-1088. Where should I keep my medication? Keep out of the reach of children. Store at room temperature between 15 and 30 degrees C (59 and 85 degrees F). Protect from light. Throw away any unused medication after the expiration date. NOTE: This sheet is a summary. It may not cover all possible information. If you have questions about this medicine, talk to your doctor, pharmacist, or health care provider.  2024 Elsevier/Gold Standard (2020-10-15 00:00:00)      To help prevent nausea and vomiting after your treatment, we encourage you to take your nausea medication as directed.  BELOW ARE SYMPTOMS THAT SHOULD BE REPORTED IMMEDIATELY: *FEVER GREATER THAN 100.4 F (38 C) OR HIGHER *CHILLS OR SWEATING *NAUSEA AND VOMITING THAT IS NOT CONTROLLED WITH YOUR NAUSEA MEDICATION *UNUSUAL SHORTNESS OF BREATH *UNUSUAL BRUISING OR BLEEDING *URINARY PROBLEMS (pain or burning when urinating, or frequent urination) *BOWEL PROBLEMS (unusual diarrhea, constipation, pain near the anus) TENDERNESS IN MOUTH AND THROAT WITH OR WITHOUT PRESENCE OF ULCERS (sore throat, sores in mouth, or a toothache) UNUSUAL RASH, SWELLING OR PAIN  UNUSUAL VAGINAL DISCHARGE OR ITCHING   Items with * indicate a potential emergency and should be followed up as soon as possible or go to the Emergency Department if any problems should occur.  Please show the CHEMOTHERAPY ALERT CARD or IMMUNOTHERAPY ALERT CARD at check-in to the Emergency Department and triage nurse.  Should you have questions after your visit or need to cancel or reschedule your appointment, please contact [facility] [place] - A DEPT OF [facility] [phone]  and follow the prompts.  Office hours are 8:00 a.m. to 4:30 p.m. Monday - Friday. Please note that voicemails left after 4:00 p.m. may not be returned until the following business day.  We are closed weekends and major holidays. You have access to a nurse at all times for urgent questions. Please call the main number to the clinic [phone] and follow the prompts.  For any non-urgent questions, you may also contact your provider using MyChart. We now offer e-Visits for anyone 4 and older to request care online for non-urgent symptoms. For details visit [email or web address].   Also download the MyChart app! Go to the app store, search "MyChart", open the app, select [place], and log in with your MyChart username and password.

## 2023-09-03 NOTE — Progress Notes (Addendum)
 [facility] at [facility]  HEMATOLOGY FOLLOW-UP VISIT  Frank Yancey LABOR, MD  REASON FOR FOLLOW-UP: Thrombocytopenia  ASSESSMENT & PLAN:  Patient is a 89 y.o. male following for thrombocytopenia  Assessment & Plan Thrombocytopenia (HCC) Patient had chronic thrombocytopenia for several years based on lab review at least since 2009, usual range is 100-120 Likely secondary to recent amoxicillin use and vitamin B12 deficiency LDH and haptoglobin normal.  Normal iron and copper  level Hepatitis B&C negative Peripheral smear with no schistocytes and occasional small platelet clumps and big platelets Ultrasound abdomen showed no evidence of hepatosplenomegaly Patient did a trial of 4 days of dexamethasone  with no significant improvement No increased bleeding or bruising  - Platelets more or less stable at 36 - Continue parenteral and oral vitamin B12 supplementation - Continue to monitor counts at this time - Recommended to avoid dental procedures at this time for the risk of bleeding  Return to clinic in 2 weeks for labs and a visit in 4 weeks with labs Vitamin B12 deficiency Patient has significant vitamin B12 deficiency with a level of less than 200 Started on oral and parenteral vitamin B12 supplementation  - Continue oral and parenteral vitamin B12 supplementation    Orders Placed This Encounter  Procedures   CBC with Differential/Platelet    Standing Status:   Future    Expected Date:   09/17/2023    Expiration Date:   12/16/2023   CBC with Differential/Platelet    Standing Status:   Future    Expected Date:   10/01/2023    Expiration Date:   09/02/2024    The total time spent in the appointment was 20 minutes encounter with patients including review of chart and various tests results, discussions about plan of care and coordination of care plan   All questions were answered. The patient knows to call the clinic with any problems, questions or  concerns. No barriers to learning was detected.  [name], MD 7/18/202512:39 PM   SUMMARY OF HEMATOLOGIC HISTORY: Chronic thrombocytopenia with a platelet range of 100-120 at least since 2009 Recent drop to 17 Labs consistent with severe vitamin B12 deficiency Ultrasound abdomen showed no hepatosplenomegaly   INTERVAL HISTORY: [name] 89 y.o. male following for thrombocytopenia. Patient was accompanied by his wife today.  He reports feeling significantly better and has no complaints today.  Reports that he has a lot of energy and has been able to clean his pool and mow his lawn. Denies bleeding or bruising.  I have reviewed the past medical history, past surgical history, social history and family history with the patient   ALLERGIES:  has no known allergies.  MEDICATIONS:  Current Outpatient Medications  Medication Sig Dispense Refill   allopurinol (ZYLOPRIM) 300 MG tablet Take 1 tablet by mouth daily.     amLODipine  (NORVASC ) 10 MG tablet TAKE 1 TABLET BY MOUTH EVERY DAY 90 tablet 1   aspirin 81 MG tablet Take 81 mg by mouth daily.     cholecalciferol (VITAMIN D) 1000 UNITS tablet Take 2,000 Units by mouth daily.     cyanocobalamin  (VITAMIN B12) 1000 MCG tablet Take 1 tablet (1,000 mcg total) by mouth daily. 90 tablet 2   isosorbide mononitrate (IMDUR) 60 MG 24 hr tablet Take 60 mg by mouth daily.     lisinopril  (ZESTRIL ) 10 MG tablet TAKE 1 TABLET BY MOUTH EVERY DAY 90 tablet 1   meloxicam (MOBIC) 7.5 MG tablet Take 1 tablet  by mouth daily.     nitroGLYCERIN  (NITROSTAT ) 0.4 MG SL tablet Place 1 tablet (0.4 mg total) under the tongue every 5 (five) minutes as needed. 25 tablet 3   potassium chloride SA (KLOR-CON M) 20 MEQ tablet Take 20 mEq by mouth daily.     pravastatin  (PRAVACHOL ) 40 MG tablet TAKE 1 TABLET BY MOUTH EVERY EVENING 90 tablet 1   SHINGRIX injection Inject 0.5 mLs into the muscle once.     No current facility-administered medications for  this visit.     REVIEW OF SYSTEMS:   Constitutional: Denies fevers, chills or night sweats Eyes: Denies blurriness of vision Ears, nose, mouth, throat, and face: Denies mucositis or sore throat Respiratory: Denies cough, dyspnea or wheezes Cardiovascular: Denies palpitation, chest discomfort or lower extremity swelling Gastrointestinal:  Denies nausea, heartburn or change in bowel habits Skin: Denies abnormal skin rashes Lymphatics: Denies new lymphadenopathy or easy bruising Neurological:Denies numbness, tingling or new weaknesses Behavioral/Psych: Mood is stable, no new changes  All other systems were reviewed with the patient and are negative.  PHYSICAL EXAMINATION:   Vitals:   09/03/23 0823  BP: 137/70   GENERAL:alert, no distress and comfortable SKIN: skin color, texture, turgor are normal, no rashes or significant lesions LUNGS: clear to auscultation and percussion with normal breathing effort HEART: regular rate & rhythm and no murmurs and no lower extremity edema ABDOMEN:abdomen soft, non-tender and normal bowel sounds Musculoskeletal:no cyanosis of digits and no clubbing  NEURO: alert & oriented x 3 with fluent speech  LABORATORY DATA:  I have reviewed the data as listed  Lab Results  Component Value Date   WBC 8.6 09/03/2023   NEUTROABS 5.4 09/03/2023   HGB 16.5 09/03/2023   HCT 49.4 09/03/2023   MCV 93.7 09/03/2023   PLT 36 (L) 09/03/2023      Chemistry      Component Value Date/Time   NA 138 08/19/2023 0809   K 3.8 08/19/2023 0809   CL 102 08/19/2023 0809   CO2 27 08/19/2023 0809   BUN 16 08/19/2023 0809   CREATININE 1.02 08/19/2023 0809      Component Value Date/Time   CALCIUM 8.6 (L) 08/19/2023 0809   ALKPHOS 46 08/19/2023 0809   AST 22 08/19/2023 0809   ALT 13 08/19/2023 0809   BILITOT 0.9 08/19/2023 0809       Latest Reference Range & Units 08/19/23 08:09  Iron 45 - 182 ug/dL 89  UIBC ug/dL 855  TIBC 749 - 549 ug/dL 766 (L)   Saturation Ratios 17.9 - 39.5 % 38  Ferritin 24 - 336 ng/mL 139  Vitamin B12 180 - 914 pg/mL 161 (L)  (L): Data is abnormally low  RADIOGRAPHIC STUDIES: I have personally reviewed the radiological images as listed and agreed with the findings in the report.  None new to review

## 2023-09-08 ENCOUNTER — Encounter: Payer: Self-pay | Admitting: Cardiology

## 2023-09-08 ENCOUNTER — Ambulatory Visit: Attending: Cardiology | Admitting: Cardiology

## 2023-09-08 VITALS — BP 148/78 | HR 66 | Ht 70.0 in | Wt 159.6 lb

## 2023-09-08 DIAGNOSIS — I10 Essential (primary) hypertension: Secondary | ICD-10-CM | POA: Diagnosis not present

## 2023-09-08 DIAGNOSIS — E782 Mixed hyperlipidemia: Secondary | ICD-10-CM | POA: Insufficient documentation

## 2023-09-08 DIAGNOSIS — I25119 Atherosclerotic heart disease of native coronary artery with unspecified angina pectoris: Secondary | ICD-10-CM | POA: Diagnosis not present

## 2023-09-08 NOTE — Patient Instructions (Addendum)

## 2023-09-08 NOTE — Progress Notes (Signed)
    Cardiology Office Note  Date: 09/08/2023   ID: [name], DOB [birth year], MRN [id number]  History of Present Illness: [name] is an 89 y.o. male last seen in May 2024.  He is here for a follow-up visit.  He does not report any angina or interval nitroglycerin  use.  Had an episode of near syncope in church back in June, was seen in the ER with relatively low pressure.  Since that time he stopped all of his medications and then eventually added back Norvasc  and lisinopril .  With somewhat higher blood pressure he states that he feels much better, more energy.  We went over his medications.  We discussed adding back aspirin and Pravachol  next.  He has as needed nitroglycerin  available as well.  I reviewed his ECG today which shows sinus rhythm with decreased R wave progression.  Physical Exam: VS:  BP (!) 148/78 (BP Location: Left Arm)   Pulse 66   Ht 5' 10 (1.778 m)   Wt 159 lb 9.6 oz (72.4 kg)   SpO2 96%   BMI 22.90 kg/m , BMI Body mass index is 22.9 kg/m.  Wt Readings from Last 3 Encounters:  09/08/23 159 lb 9.6 oz (72.4 kg)  09/03/23 160 lb 4.4 oz (72.7 kg)  08/24/23 165 lb (74.8 kg)    General: Patient appears comfortable at rest. HEENT: Conjunctiva and lids normal. Neck: Supple, no elevated JVP or carotid bruits. Lungs: Clear to auscultation, nonlabored breathing at rest. Cardiac: Regular rate and rhythm, no S3, 2/6 systolic murmur. Extremities: No pitting edema.  ECG:  An ECG dated 07/02/2022 was personally reviewed today and demonstrated:  Sinus bradycardia.  Labwork: December 2024: Cholesterol 115, triglycerides 118, HDL 47, LDL 47 08/12/2023: TSH 1.713 08/19/2023: ALT 13; AST 22; BUN 16; Creatinine, Ser 1.02; Potassium 3.8; Sodium 138 09/03/2023: Hemoglobin 16.5; Platelets 36   Other Studies Reviewed Today:  No interval cardiac testing for review today.  Assessment and Plan:  1.  CAD status post DES to the LAD in 2009 with mild to  moderate residual disease managed medically.  Follow-up Myoview  in 2018 was low risk and LVEF 60%.  He reports no angina or nitroglycerin  use.  Recommended resuming aspirin 81 mg daily and Pravachol  40 mg daily.   2.  Mixed hyperlipidemia.  LDL 47 in December 2024.  Would continue Pravachol  40 mg daily.   3.  Primary hypertension.  Allowing blood pressure to run somewhat higher at this point.  Continue Norvasc  10 mg daily and lisinopril  10 mg daily.  Continue to track measurements.  He is no longer on Imdur or Hytrin .  Disposition:  Follow up 6 months.  Signed, Jayson JUDITHANN Sierras, M.D., F.A.C.C. [facility]

## 2023-09-09 ENCOUNTER — Inpatient Hospital Stay

## 2023-09-09 VITALS — BP 158/67 | HR 76 | Temp 98.1°F | Resp 17

## 2023-09-09 DIAGNOSIS — E538 Deficiency of other specified B group vitamins: Secondary | ICD-10-CM | POA: Diagnosis not present

## 2023-09-09 DIAGNOSIS — D696 Thrombocytopenia, unspecified: Secondary | ICD-10-CM | POA: Diagnosis not present

## 2023-09-09 DIAGNOSIS — Z79899 Other long term (current) drug therapy: Secondary | ICD-10-CM | POA: Diagnosis not present

## 2023-09-09 MED ORDER — CYANOCOBALAMIN 1000 MCG/ML IJ SOLN
1000.0000 ug | Freq: Once | INTRAMUSCULAR | Status: AC
  Administered 2023-09-09: 1000 ug via INTRAMUSCULAR
  Filled 2023-09-09: qty 1

## 2023-09-09 NOTE — Progress Notes (Signed)
 B12 injection  given per orders. Patient tolerated it well without problems. Vitals stable and discharged home from clinic ambulatory. Follow up as scheduled.

## 2023-09-09 NOTE — Patient Instructions (Signed)
 [facility] [place] - A DEPT OF [facility]  Discharge Instructions: Thank you for choosing [facility] to provide your oncology and hematology care.  If you have a lab appointment with the Cancer Center - please note that after April 8th, 2024, all labs will be drawn in the cancer center.  You do not have to check in or register with the main entrance as you have in the past but will complete your check-in in the cancer center.  Wear comfortable clothing and clothing appropriate for easy access to any Portacath or PICC line.   We strive to give you quality time with your provider. You may need to reschedule your appointment if you arrive late (15 or more minutes).  Arriving late affects you and other patients whose appointments are after yours.  Also, if you miss three or more appointments without notifying the office, you may be dismissed from the clinic at the provider's discretion.      For prescription refill requests, have your pharmacy contact our office and allow 72 hours for refills to be completed.    Today you received the following injection: B12   To help prevent nausea and vomiting after your treatment, we encourage you to take your nausea medication as directed.  BELOW ARE SYMPTOMS THAT SHOULD BE REPORTED IMMEDIATELY: *FEVER GREATER THAN 100.4 F (38 C) OR HIGHER *CHILLS OR SWEATING *NAUSEA AND VOMITING THAT IS NOT CONTROLLED WITH YOUR NAUSEA MEDICATION *UNUSUAL SHORTNESS OF BREATH *UNUSUAL BRUISING OR BLEEDING *URINARY PROBLEMS (pain or burning when urinating, or frequent urination) *BOWEL PROBLEMS (unusual diarrhea, constipation, pain near the anus) TENDERNESS IN MOUTH AND THROAT WITH OR WITHOUT PRESENCE OF ULCERS (sore throat, sores in mouth, or a toothache) UNUSUAL RASH, SWELLING OR PAIN  UNUSUAL VAGINAL DISCHARGE OR ITCHING   Items with * indicate a potential emergency and should be followed up as soon as possible or go to the  Emergency Department if any problems should occur.  Please show the CHEMOTHERAPY ALERT CARD or IMMUNOTHERAPY ALERT CARD at check-in to the Emergency Department and triage nurse.  Should you have questions after your visit or need to cancel or reschedule your appointment, please contact [facility] [place] - A DEPT OF [facility] [phone]  and follow the prompts.  Office hours are 8:00 a.m. to 4:30 p.m. Monday - Friday. Please note that voicemails left after 4:00 p.m. may not be returned until the following business day.  We are closed weekends and major holidays. You have access to a nurse at all times for urgent questions. Please call the main number to the clinic [phone] and follow the prompts.  For any non-urgent questions, you may also contact your provider using MyChart. We now offer e-Visits for anyone 73 and older to request care online for non-urgent symptoms. For details visit [email or web address].   Also download the MyChart app! Go to the app store, search "MyChart", open the app, select [facility], and log in with your MyChart username and password.

## 2023-09-17 ENCOUNTER — Inpatient Hospital Stay: Attending: Hematology

## 2023-09-17 DIAGNOSIS — E538 Deficiency of other specified B group vitamins: Secondary | ICD-10-CM | POA: Insufficient documentation

## 2023-09-17 DIAGNOSIS — D696 Thrombocytopenia, unspecified: Secondary | ICD-10-CM | POA: Insufficient documentation

## 2023-09-17 DIAGNOSIS — D72829 Elevated white blood cell count, unspecified: Secondary | ICD-10-CM | POA: Diagnosis not present

## 2023-09-17 LAB — CBC WITH DIFFERENTIAL/PLATELET
Abs Immature Granulocytes: 0.04 K/uL (ref 0.00–0.07)
Basophils Absolute: 0 K/uL (ref 0.0–0.1)
Basophils Relative: 0 %
Eosinophils Absolute: 0.3 K/uL (ref 0.0–0.5)
Eosinophils Relative: 4 %
HCT: 47.9 % (ref 39.0–52.0)
Hemoglobin: 16.4 g/dL (ref 13.0–17.0)
Immature Granulocytes: 1 %
Lymphocytes Relative: 25 %
Lymphs Abs: 1.9 K/uL (ref 0.7–4.0)
MCH: 31.7 pg (ref 26.0–34.0)
MCHC: 34.2 g/dL (ref 30.0–36.0)
MCV: 92.6 fL (ref 80.0–100.0)
Monocytes Absolute: 1.5 K/uL — ABNORMAL HIGH (ref 0.1–1.0)
Monocytes Relative: 20 %
Neutro Abs: 3.7 K/uL (ref 1.7–7.7)
Neutrophils Relative %: 50 %
Platelets: 66 K/uL — ABNORMAL LOW (ref 150–400)
RBC: 5.17 MIL/uL (ref 4.22–5.81)
RDW: 13.3 % (ref 11.5–15.5)
WBC: 7.3 K/uL (ref 4.0–10.5)
nRBC: 0 % (ref 0.0–0.2)

## 2023-10-04 ENCOUNTER — Other Ambulatory Visit: Payer: Self-pay | Admitting: Cardiology

## 2023-10-07 NOTE — Progress Notes (Signed)
 [facility] at [facility]  HEMATOLOGY FOLLOW-UP VISIT  Frank Yancey LABOR, MD  REASON FOR FOLLOW-UP: Thrombocytopenia  ASSESSMENT & PLAN:  Patient is a 89 y.o. male following for thrombocytopenia  Assessment & Plan Thrombocytopenia (HCC) Patient had chronic thrombocytopenia for several years based on lab review at least since 2009, usual range is 100-120 Likely secondary to recent amoxicillin use and vitamin B12 deficiency LDH and haptoglobin normal.  Normal iron and copper  level Hepatitis B&C negative Peripheral smear with no schistocytes and occasional small platelet clumps and big platelets Ultrasound abdomen showed no evidence of hepatosplenomegaly Patient did a trial of 4 days of dexamethasone  with no significant improvement No increased bleeding or bruising  - Platelets more or less stable with slight improvement from prior. - Continue parenteral and oral vitamin B12 supplementation - Continue to monitor counts at this time - Recommended to avoid dental procedures at this time for the risk of bleeding  Return to clinic in 4 weeks with labs Vitamin B12 deficiency Patient has significant vitamin B12 deficiency with a level of less than 200 Started on oral and parenteral vitamin B12 supplementation  - Continue oral vitamin B12 supplementation -Will restart his parenteral B12 supplementation Leukocytosis, unspecified type Likely secondary to steroid use Resolved at this time    Orders Placed This Encounter  Procedures   Ferritin    Standing Status:   Future    Expected Date:   11/19/2023    Expiration Date:   02/17/2024   Folate    Standing Status:   Future    Expected Date:   11/19/2023    Expiration Date:   02/17/2024   Vitamin B12    Standing Status:   Future    Expected Date:   11/19/2023    Expiration Date:   02/17/2024   CBC with Differential/Platelet    Standing Status:   Future    Expected Date:   11/19/2023    Expiration Date:   02/17/2024    Comprehensive metabolic panel with GFR    Standing Status:   Future    Expected Date:   11/19/2023    Expiration Date:   02/17/2024   Iron and TIBC    Standing Status:   Future    Expected Date:   11/19/2023    Expiration Date:   02/17/2024    The total time spent in the appointment was 20 minutes encounter with patients including review of chart and various tests results, discussions about plan of care and coordination of care plan   All questions were answered. The patient knows to call the clinic with any problems, questions or concerns. No barriers to learning was detected.   [name],acting as a Neurosurgeon for Frank Dry, MD.,have documented all relevant documentation on the behalf of Frank Dry, MD,as directed by  Frank Dry, MD while in the presence of Frank Dry, MD.  I, Frank Dry MD, have reviewed the above documentation for accuracy and completeness, and I agree with the above.    Frank Dry, MD 8/23/202511:20 AM   SUMMARY OF HEMATOLOGIC HISTORY: Chronic thrombocytopenia with a platelet range of 100-120 at least since 2009 Recent drop to 17 Labs consistent with severe vitamin B12 deficiency Ultrasound abdomen showed no hepatosplenomegaly   INTERVAL HISTORY: [name] 89 y.o. male following for thrombocytopenia. Patient is accompanied by his wife today.  He denies any bleeding, ecchymosis, purpura.  He is feeling well with no loss of weight or  loss of appetite.  He states that he has significant energy since starting vitamin B12 pills and is able to mow his yard and clean his pool.  He is continue to take vitamin B12 pills daily  I have reviewed the past medical history, past surgical history, social history and family history with the patient   ALLERGIES:  has no known allergies.  MEDICATIONS:  Current Outpatient Medications  Medication Sig Dispense Refill   allopurinol (ZYLOPRIM) 300 MG tablet Take 1 tablet by mouth  daily.     amLODipine  (NORVASC ) 10 MG tablet TAKE 1 TABLET BY MOUTH EVERY DAY 90 tablet 3   aspirin 81 MG tablet Take 81 mg by mouth daily.     cholecalciferol (VITAMIN D) 1000 UNITS tablet Take 2,000 Units by mouth daily.     cyanocobalamin  (VITAMIN B12) 1000 MCG tablet Take 1 tablet (1,000 mcg total) by mouth daily. 90 tablet 2   isosorbide mononitrate (IMDUR) 60 MG 24 hr tablet Take 60 mg by mouth daily.     lisinopril  (ZESTRIL ) 10 MG tablet TAKE 1 TABLET BY MOUTH EVERY DAY 90 tablet 3   meloxicam (MOBIC) 7.5 MG tablet Take 1 tablet by mouth daily.     nitroGLYCERIN  (NITROSTAT ) 0.4 MG SL tablet Place 1 tablet (0.4 mg total) under the tongue every 5 (five) minutes as needed. 25 tablet 3   potassium chloride SA (KLOR-CON M) 20 MEQ tablet Take 20 mEq by mouth daily.     pravastatin  (PRAVACHOL ) 40 MG tablet TAKE 1 TABLET BY MOUTH EVERY EVENING 90 tablet 1   SHINGRIX injection Inject 0.5 mLs into the muscle once.     No current facility-administered medications for this visit.     REVIEW OF SYSTEMS:   Constitutional: Denies fevers, chills or night sweats Eyes: Denies blurriness of vision Ears, nose, mouth, throat, and face: Denies mucositis or sore throat Respiratory: Denies cough, dyspnea or wheezes Cardiovascular: Denies palpitation, chest discomfort or lower extremity swelling Gastrointestinal:  Denies nausea, heartburn or change in bowel habits Skin: Denies abnormal skin rashes Lymphatics: Denies new lymphadenopathy or easy bruising Neurological:Denies numbness, tingling or new weaknesses Behavioral/Psych: Mood is stable, no new changes  All other systems were reviewed with the patient and are negative.  PHYSICAL EXAMINATION:   Vitals:   10/08/23 0944 10/08/23 0949  BP: (!) 162/86 (!) 156/82  Pulse: 80   Resp: 18   Temp: 97.7 F (36.5 C)   SpO2: 96%     GENERAL:alert, no distress and comfortable SKIN: skin color, texture, turgor are normal, no rashes or significant  lesions LUNGS: clear to auscultation and percussion with normal breathing effort HEART: regular rate & rhythm and no murmurs and no lower extremity edema ABDOMEN:abdomen soft, non-tender and normal bowel sounds Musculoskeletal:no cyanosis of digits and no clubbing  NEURO: alert & oriented x 3 with fluent speech  LABORATORY DATA:  I have reviewed the data as listed  Lab Results  Component Value Date   WBC 8.7 10/08/2023   NEUTROABS 4.7 10/08/2023   HGB 16.8 10/08/2023   HCT 50.2 10/08/2023   MCV 92.6 10/08/2023   PLT 45 (L) 10/08/2023      Chemistry      Component Value Date/Time   NA 138 08/19/2023 0809   K 3.8 08/19/2023 0809   CL 102 08/19/2023 0809   CO2 27 08/19/2023 0809   BUN 16 08/19/2023 0809   CREATININE 1.02 08/19/2023 0809      Component Value Date/Time   CALCIUM  8.6 (L) 08/19/2023 0809   ALKPHOS 46 08/19/2023 0809   AST 22 08/19/2023 0809   ALT 13 08/19/2023 0809   BILITOT 0.9 08/19/2023 0809       Latest Reference Range & Units 08/19/23 08:09  Iron 45 - 182 ug/dL 89  UIBC ug/dL 855  TIBC 749 - 549 ug/dL 766 (L)  Saturation Ratios 17.9 - 39.5 % 38  Ferritin 24 - 336 ng/mL 139  Vitamin B12 180 - 914 pg/mL 161 (L)  (L): Data is abnormally low  RADIOGRAPHIC STUDIES: I have personally reviewed the radiological images as listed and agreed with the findings in the report.  None new to review

## 2023-10-08 ENCOUNTER — Ambulatory Visit: Payer: Self-pay

## 2023-10-08 ENCOUNTER — Inpatient Hospital Stay (HOSPITAL_BASED_OUTPATIENT_CLINIC_OR_DEPARTMENT_OTHER): Admitting: Oncology

## 2023-10-08 ENCOUNTER — Inpatient Hospital Stay: Admitting: Oncology

## 2023-10-08 ENCOUNTER — Inpatient Hospital Stay

## 2023-10-08 VITALS — BP 156/82 | HR 80 | Temp 97.7°F | Resp 18 | Wt 155.4 lb

## 2023-10-08 DIAGNOSIS — D72829 Elevated white blood cell count, unspecified: Secondary | ICD-10-CM | POA: Diagnosis not present

## 2023-10-08 DIAGNOSIS — D696 Thrombocytopenia, unspecified: Secondary | ICD-10-CM

## 2023-10-08 DIAGNOSIS — E538 Deficiency of other specified B group vitamins: Secondary | ICD-10-CM | POA: Diagnosis not present

## 2023-10-08 LAB — CBC WITH DIFFERENTIAL/PLATELET
Abs Granulocyte: 4.7 K/uL (ref 1.5–6.5)
Abs Immature Granulocytes: 0.03 K/uL (ref 0.00–0.07)
Basophils Absolute: 0.1 K/uL (ref 0.0–0.1)
Basophils Relative: 1 %
Eosinophils Absolute: 0.2 K/uL (ref 0.0–0.5)
Eosinophils Relative: 2 %
HCT: 50.2 % (ref 39.0–52.0)
Hemoglobin: 16.8 g/dL (ref 13.0–17.0)
Immature Granulocytes: 0 %
Lymphocytes Relative: 27 %
Lymphs Abs: 2.4 K/uL (ref 0.7–4.0)
MCH: 31 pg (ref 26.0–34.0)
MCHC: 33.5 g/dL (ref 30.0–36.0)
MCV: 92.6 fL (ref 80.0–100.0)
Monocytes Absolute: 1.3 K/uL — ABNORMAL HIGH (ref 0.1–1.0)
Monocytes Relative: 15 %
Neutro Abs: 4.7 K/uL (ref 1.7–7.7)
Neutrophils Relative %: 55 %
Platelets: 45 K/uL — ABNORMAL LOW (ref 150–400)
RBC: 5.42 MIL/uL (ref 4.22–5.81)
RDW: 13.2 % (ref 11.5–15.5)
WBC: 8.7 K/uL (ref 4.0–10.5)
nRBC: 0 % (ref 0.0–0.2)

## 2023-10-08 NOTE — Progress Notes (Signed)
 Called patient and he is aware and agreeable with plan. Transferred to scheduler Amy to schedule the appointments.

## 2023-10-09 ENCOUNTER — Encounter: Payer: Self-pay | Admitting: Oncology

## 2023-10-09 NOTE — Assessment & Plan Note (Addendum)
-  Likely secondary to steroid use -Resolved at this time

## 2023-10-09 NOTE — Assessment & Plan Note (Addendum)
 Patient has significant vitamin B12 deficiency with a level of less than 200 Started on oral and parenteral vitamin B12 supplementation  - Continue oral vitamin B12 supplementation -Will restart his parenteral B12 supplementation

## 2023-10-09 NOTE — Assessment & Plan Note (Addendum)
 Patient had chronic thrombocytopenia for several years based on lab review at least since 2009, usual range is 100-120 Likely secondary to recent amoxicillin use and vitamin B12 deficiency LDH and haptoglobin normal.  Normal iron and copper  level Hepatitis B&C negative Peripheral smear with no schistocytes and occasional small platelet clumps and big platelets Ultrasound abdomen showed no evidence of hepatosplenomegaly Patient did a trial of 4 days of dexamethasone  with no significant improvement No increased bleeding or bruising  - Platelets more or less stable with slight improvement from prior. - Continue parenteral and oral vitamin B12 supplementation - Continue to monitor counts at this time - Recommended to avoid dental procedures at this time for the risk of bleeding  Return to clinic in 4 weeks with labs

## 2023-10-12 ENCOUNTER — Inpatient Hospital Stay

## 2023-10-12 VITALS — BP 147/86 | HR 78 | Temp 97.1°F | Resp 18

## 2023-10-12 DIAGNOSIS — E538 Deficiency of other specified B group vitamins: Secondary | ICD-10-CM | POA: Diagnosis not present

## 2023-10-12 DIAGNOSIS — D696 Thrombocytopenia, unspecified: Secondary | ICD-10-CM | POA: Diagnosis not present

## 2023-10-12 DIAGNOSIS — D72829 Elevated white blood cell count, unspecified: Secondary | ICD-10-CM | POA: Diagnosis not present

## 2023-10-12 MED ORDER — CYANOCOBALAMIN 1000 MCG/ML IJ SOLN
1000.0000 ug | Freq: Once | INTRAMUSCULAR | Status: AC
  Administered 2023-10-12: 1000 ug via INTRAMUSCULAR
  Filled 2023-10-12: qty 1

## 2023-10-12 NOTE — Patient Instructions (Signed)
 [facility] [place] - A DEPT OF [facility]  Discharge Instructions: Thank you for choosing [facility] to provide your oncology and hematology care.  If you have a lab appointment with the Cancer Center - please note that after April 8th, 2024, all labs will be drawn in the cancer center.  You do not have to check in or register with the main entrance as you have in the past but will complete your check-in in the cancer center.  Wear comfortable clothing and clothing appropriate for easy access to any Portacath or PICC line.   We strive to give you quality time with your provider. You may need to reschedule your appointment if you arrive late (15 or more minutes).  Arriving late affects you and other patients whose appointments are after yours.  Also, if you miss three or more appointments without notifying the office, you may be dismissed from the clinic at the provider's discretion.      For prescription refill requests, have your pharmacy contact our office and allow 72 hours for refills to be completed.    Today you received the following injection: B12   To help prevent nausea and vomiting after your treatment, we encourage you to take your nausea medication as directed.  BELOW ARE SYMPTOMS THAT SHOULD BE REPORTED IMMEDIATELY: *FEVER GREATER THAN 100.4 F (38 C) OR HIGHER *CHILLS OR SWEATING *NAUSEA AND VOMITING THAT IS NOT CONTROLLED WITH YOUR NAUSEA MEDICATION *UNUSUAL SHORTNESS OF BREATH *UNUSUAL BRUISING OR BLEEDING *URINARY PROBLEMS (pain or burning when urinating, or frequent urination) *BOWEL PROBLEMS (unusual diarrhea, constipation, pain near the anus) TENDERNESS IN MOUTH AND THROAT WITH OR WITHOUT PRESENCE OF ULCERS (sore throat, sores in mouth, or a toothache) UNUSUAL RASH, SWELLING OR PAIN  UNUSUAL VAGINAL DISCHARGE OR ITCHING   Items with * indicate a potential emergency and should be followed up as soon as possible or go to the  Emergency Department if any problems should occur.  Please show the CHEMOTHERAPY ALERT CARD or IMMUNOTHERAPY ALERT CARD at check-in to the Emergency Department and triage nurse.  Should you have questions after your visit or need to cancel or reschedule your appointment, please contact [facility] [place] - A DEPT OF [facility] [phone]  and follow the prompts.  Office hours are 8:00 a.m. to 4:30 p.m. Monday - Friday. Please note that voicemails left after 4:00 p.m. may not be returned until the following business day.  We are closed weekends and major holidays. You have access to a nurse at all times for urgent questions. Please call the main number to the clinic [phone] and follow the prompts.  For any non-urgent questions, you may also contact your provider using MyChart. We now offer e-Visits for anyone 73 and older to request care online for non-urgent symptoms. For details visit [email or web address].   Also download the MyChart app! Go to the app store, search "MyChart", open the app, select [facility], and log in with your MyChart username and password.

## 2023-10-12 NOTE — Progress Notes (Signed)
 B12 injection  given per orders. Patient tolerated it well without problems. Vitals stable and discharged home from clinic ambulatory. Follow up as scheduled.

## 2023-11-10 DIAGNOSIS — N1831 Chronic kidney disease, stage 3a: Secondary | ICD-10-CM | POA: Diagnosis not present

## 2023-11-10 DIAGNOSIS — E7849 Other hyperlipidemia: Secondary | ICD-10-CM | POA: Diagnosis not present

## 2023-11-10 DIAGNOSIS — I700 Atherosclerosis of aorta: Secondary | ICD-10-CM | POA: Diagnosis not present

## 2023-11-10 DIAGNOSIS — D6949 Other primary thrombocytopenia: Secondary | ICD-10-CM | POA: Diagnosis not present

## 2023-11-10 DIAGNOSIS — Z1331 Encounter for screening for depression: Secondary | ICD-10-CM | POA: Diagnosis not present

## 2023-11-10 DIAGNOSIS — I10 Essential (primary) hypertension: Secondary | ICD-10-CM | POA: Diagnosis not present

## 2023-11-10 DIAGNOSIS — Z0000 Encounter for general adult medical examination without abnormal findings: Secondary | ICD-10-CM | POA: Diagnosis not present

## 2023-11-12 ENCOUNTER — Inpatient Hospital Stay: Attending: Oncology

## 2023-11-12 VITALS — BP 164/80 | HR 74 | Temp 97.8°F | Resp 20

## 2023-11-12 DIAGNOSIS — E538 Deficiency of other specified B group vitamins: Secondary | ICD-10-CM | POA: Insufficient documentation

## 2023-11-12 MED ORDER — CYANOCOBALAMIN 1000 MCG/ML IJ SOLN
1000.0000 ug | Freq: Once | INTRAMUSCULAR | Status: AC
  Administered 2023-11-12: 1000 ug via INTRAMUSCULAR
  Filled 2023-11-12: qty 1

## 2023-11-12 NOTE — Patient Instructions (Signed)
 Vitamin B12 Injection What is this medication? Vitamin B12 (VAHY tuh min B12) prevents and treats low vitamin B12 levels in your body. It is used in people who do not get enough vitamin B12 from their diet or when their digestive tract does not absorb enough. Vitamin B12 plays an important role in maintaining the health of your nervous system and red blood cells. This medicine may be used for other purposes; ask your health care provider or pharmacist if you have questions. COMMON BRAND NAME(S): B-12 Compliance Kit, B-12 Injection Kit, Cyomin, Dodex , LA-12, Nutri-Twelve, Physicians EZ Use B-12, Primabalt, Vitamin Deficiency Injectable System - B12 What should I tell my care team before I take this medication? They need to know if you have any of these conditions: Kidney disease Leber's disease Megaloblastic anemia An unusual or allergic reaction to cyanocobalamin , cobalt, other medications, foods, dyes, or preservatives Pregnant or trying to get pregnant Breast-feeding How should I use this medication? This medication is injected into a muscle or deeply under the skin. It is usually given in a clinic or care team's office. However, your care team may teach you how to inject yourself. Follow all instructions. Talk to your care team about the use of this medication in children. Special care may be needed. Overdosage: If you think you have taken too much of this medicine contact a poison control center or emergency room at once. NOTE: This medicine is only for you. Do not share this medicine with others. What if I miss a dose? If you are given your dose at a clinic or care team's office, call to reschedule your appointment. If you give your own injections, and you miss a dose, take it as soon as you can. If it is almost time for your next dose, take only that dose. Do not take double or extra doses. What may interact with this medication? Alcohol Colchicine This list may not describe all possible  interactions. Give your health care provider a list of all the medicines, herbs, non-prescription drugs, or dietary supplements you use. Also tell them if you smoke, drink alcohol, or use illegal drugs. Some items may interact with your medicine. What should I watch for while using this medication? Visit your care team regularly. You may need blood work done while you are taking this medication. You may need to follow a special diet. Talk to your care team. Limit your alcohol intake and avoid smoking to get the best benefit. What side effects may I notice from receiving this medication? Side effects that you should report to your care team as soon as possible: Allergic reactions--skin rash, itching, hives, swelling of the face, lips, tongue, or throat Swelling of the ankles, hands, or feet Trouble breathing Side effects that usually do not require medical attention (report to your care team if they continue or are bothersome): Diarrhea This list may not describe all possible side effects. Call your doctor for medical advice about side effects. You may report side effects to FDA at 1-800-FDA-1088. Where should I keep my medication? Keep out of the reach of children. Store at room temperature between 15 and 30 degrees C (59 and 85 degrees F). Protect from light. Throw away any unused medication after the expiration date. NOTE: This sheet is a summary. It may not cover all possible information. If you have questions about this medicine, talk to your doctor, pharmacist, or health care provider.  2024 Elsevier/Gold Standard (2020-10-15 00:00:00)

## 2023-11-12 NOTE — Progress Notes (Signed)
 Patient tolerated B12 injection with no complaints voiced.  Site clean and dry with no bruising or swelling noted at site.  See MAR for details.  Band aid applied.  Patient stable during and after injection.  Vss with discharge and left in satisfactory condition with no s/s of distress noted. All follow ups as scheduled.   [name]

## 2023-11-19 ENCOUNTER — Inpatient Hospital Stay: Attending: Oncology

## 2023-11-19 ENCOUNTER — Other Ambulatory Visit: Payer: Self-pay

## 2023-11-19 ENCOUNTER — Inpatient Hospital Stay (HOSPITAL_BASED_OUTPATIENT_CLINIC_OR_DEPARTMENT_OTHER): Admitting: Oncology

## 2023-11-19 VITALS — BP 165/70 | HR 80 | Temp 97.7°F | Resp 18 | Ht 70.0 in | Wt 158.4 lb

## 2023-11-19 DIAGNOSIS — Z79899 Other long term (current) drug therapy: Secondary | ICD-10-CM | POA: Insufficient documentation

## 2023-11-19 DIAGNOSIS — D696 Thrombocytopenia, unspecified: Secondary | ICD-10-CM

## 2023-11-19 DIAGNOSIS — Z791 Long term (current) use of non-steroidal anti-inflammatories (NSAID): Secondary | ICD-10-CM | POA: Insufficient documentation

## 2023-11-19 DIAGNOSIS — Z7952 Long term (current) use of systemic steroids: Secondary | ICD-10-CM | POA: Diagnosis not present

## 2023-11-19 DIAGNOSIS — D693 Immune thrombocytopenic purpura: Secondary | ICD-10-CM | POA: Diagnosis not present

## 2023-11-19 DIAGNOSIS — Z7982 Long term (current) use of aspirin: Secondary | ICD-10-CM | POA: Insufficient documentation

## 2023-11-19 DIAGNOSIS — E538 Deficiency of other specified B group vitamins: Secondary | ICD-10-CM | POA: Insufficient documentation

## 2023-11-19 DIAGNOSIS — D72829 Elevated white blood cell count, unspecified: Secondary | ICD-10-CM

## 2023-11-19 LAB — COMPREHENSIVE METABOLIC PANEL WITH GFR
ALT: 19 U/L (ref 0–44)
AST: 26 U/L (ref 15–41)
Albumin: 4.6 g/dL (ref 3.5–5.0)
Alkaline Phosphatase: 73 U/L (ref 38–126)
Anion gap: 13 (ref 5–15)
BUN: 18 mg/dL (ref 8–23)
CO2: 29 mmol/L (ref 22–32)
Calcium: 9.7 mg/dL (ref 8.9–10.3)
Chloride: 101 mmol/L (ref 98–111)
Creatinine, Ser: 0.98 mg/dL (ref 0.61–1.24)
GFR, Estimated: >60 mL/min (ref >60)
Glucose, Bld: 95 mg/dL (ref 70–99)
Potassium: 3.9 mmol/L (ref 3.5–5.1)
Sodium: 142 mmol/L (ref 135–145)
Total Bilirubin: 0.9 mg/dL (ref 0.0–1.2)
Total Protein: 7.4 g/dL (ref 6.5–8.1)

## 2023-11-19 LAB — IRON AND TIBC
Iron: 178 ug/dL (ref 45–182)
Saturation Ratios: 63 % — ABNORMAL HIGH (ref 17.9–39.5)
TIBC: 283 ug/dL (ref 250–450)
UIBC: 105 ug/dL

## 2023-11-19 LAB — CBC WITH DIFFERENTIAL/PLATELET
Abs Immature Granulocytes: 0.02 K/uL (ref 0.00–0.07)
Basophils Absolute: 0 K/uL (ref 0.0–0.1)
Basophils Relative: 0 %
Eosinophils Absolute: 0.1 K/uL (ref 0.0–0.5)
Eosinophils Relative: 1 %
HCT: 50.5 % (ref 39.0–52.0)
Hemoglobin: 16.8 g/dL (ref 13.0–17.0)
Immature Granulocytes: 0 %
Lymphocytes Relative: 27 %
Lymphs Abs: 2 K/uL (ref 0.7–4.0)
MCH: 30.7 pg (ref 26.0–34.0)
MCHC: 33.3 g/dL (ref 30.0–36.0)
MCV: 92.2 fL (ref 80.0–100.0)
Monocytes Absolute: 1.1 K/uL — ABNORMAL HIGH (ref 0.1–1.0)
Monocytes Relative: 15 %
Neutro Abs: 4.1 K/uL (ref 1.7–7.7)
Neutrophils Relative %: 57 %
Platelets: 25 K/uL — CL (ref 150–400)
RBC: 5.48 MIL/uL (ref 4.22–5.81)
RDW: 13.4 % (ref 11.5–15.5)
WBC: 7.3 K/uL (ref 4.0–10.5)
nRBC: 0 % (ref 0.0–0.2)

## 2023-11-19 LAB — FOLATE: Folate: 15.1 ng/mL (ref >5.9)

## 2023-11-19 LAB — FERRITIN: Ferritin: 305 ng/mL (ref 24–336)

## 2023-11-19 LAB — VITAMIN B12: Vitamin B-12: 866 pg/mL (ref 180–914)

## 2023-11-19 MED ORDER — DEXAMETHASONE 4 MG PO TABS: 40.0000 mg | ORAL_TABLET | Freq: Every day | ORAL | 0 refills | Status: DC

## 2023-11-19 MED ORDER — PREDNISONE 10 MG PO TABS: 60.0000 mg | ORAL_TABLET | Freq: Every day | ORAL | 0 refills | Status: AC

## 2023-11-19 NOTE — Progress Notes (Signed)
 [name]  HEMATOLOGY FOLLOW-UP VISIT  [name], MD  REASON FOR FOLLOW-UP: Thrombocytopenia  ASSESSMENT & PLAN:  Patient is a 89 y.o. male following for thrombocytopenia  Assessment and Plan Assessment & Plan Thrombocytopenia  Chronic thrombocytopenia for several years based on lab review at least since 2009, usual range is 100-120 Likely secondary to recent amoxicillin use and vitamin B12 deficiency.  ITP on the differential LDH and haptoglobin normal.  Normal iron and copper  level Hepatitis B&C negative Peripheral smear with no schistocytes and occasional small platelet clumps and big platelets Ultrasound abdomen showed no evidence of hepatosplenomegaly Patient did a trial of 4 days of dexamethasone  with no significant improvement No increased bleeding or bruising Did not improve with vitamin B12 supplementation.   - Platelets 25 today. -Will do a trial of long prednisone taper.  Start prednisone 60 mg daily today-Will administer IVIG on Monday/Tuesday. - Continue parenteral and oral vitamin B12 supplementation - Repeat labs on Monday to assess for response -If patient does not have an improvement, will consider bone marrow biopsy to rule out other causes of  Return to clinic in 10 days with labs  Vitamin B12 deficiency Ongoing treatment with B12 supplementation. Platelet count remains low, indicating B12 deficiency may not be the sole cause of thrombocytopenia.  - Continue B12 supplementation.   No orders of the defined types were placed in this encounter.   The total time spent in the appointment was 20 minutes encounter with patients including review of chart and various tests results, discussions about plan of care and coordination of care plan   All questions were answered. The patient knows to call the clinic with any problems, questions or concerns. No barriers to learning was detected.   [name],  MD 10/3/20253:33 PM   SUMMARY OF HEMATOLOGIC HISTORY: Chronic thrombocytopenia with a platelet range of 100-120 at least since 2009 Recent drop to 17 Labs consistent with severe vitamin B12 deficiency Ultrasound abdomen showed no hepatosplenomegaly   INTERVAL HISTORY: Discussed the use of AI scribe software for clinical note transcription with the patient, who gave verbal consent to proceed.  History of Present Illness [name] is an 89 year old male who presents for evaluation of persistently low platelet counts.  He has a persistently low platelet count, currently at 25,000.   He is taking B12 tablets daily and receiving B12 shots, but there has been no significant improvement in his platelet levels.   He denies feeling unwell and maintains his energy levels. No headaches or significant bleeding issues are reported, despite the low platelet count. A small cut he sustained yesterday healed well.  He has started drinking Boost and is trying to consume more greens, such as green beans, turnip greens, and okra, to improve his health.   I have reviewed the past medical history, past surgical history, social history and family history with the patient   ALLERGIES:  has no known allergies.  MEDICATIONS:  Current Outpatient Medications  Medication Sig Dispense Refill   isosorbide mononitrate (IMDUR) 60 MG 24 hr tablet Take 60 mg by mouth daily. (Patient taking differently: Take 60 mg by mouth daily.)     olmesartan (BENICAR) 5 MG tablet Take 5 mg by mouth daily.     predniSONE (DELTASONE) 10 MG tablet Take 6 tablets (60 mg total) by mouth daily with breakfast. Please take 60mg  x 1 week, Followed by 40mg  x 1 week, Followed by 20mg  x  1 week 90 tablet 0   allopurinol (ZYLOPRIM) 300 MG tablet Take 1 tablet by mouth daily. (Patient not taking: Reported on 11/19/2023)     amLODipine  (NORVASC ) 10 MG tablet TAKE 1 TABLET BY MOUTH EVERY DAY 90 tablet 3   aspirin 81 MG tablet Take  81 mg by mouth daily. (Patient not taking: Reported on 11/19/2023)     cholecalciferol (VITAMIN D) 1000 UNITS tablet Take 2,000 Units by mouth daily.     cyanocobalamin  (VITAMIN B12) 1000 MCG tablet Take 1 tablet (1,000 mcg total) by mouth daily. 90 tablet 2   lisinopril  (ZESTRIL ) 10 MG tablet TAKE 1 TABLET BY MOUTH EVERY DAY 90 tablet 3   meloxicam (MOBIC) 7.5 MG tablet Take 1 tablet by mouth daily. (Patient not taking: Reported on 11/19/2023)     nitroGLYCERIN  (NITROSTAT ) 0.4 MG SL tablet Place 1 tablet (0.4 mg total) under the tongue every 5 (five) minutes as needed. 25 tablet 3   potassium chloride SA (KLOR-CON M) 20 MEQ tablet Take 20 mEq by mouth daily. (Patient not taking: Reported on 11/19/2023)     pravastatin  (PRAVACHOL ) 40 MG tablet TAKE 1 TABLET BY MOUTH EVERY EVENING (Patient not taking: Reported on 11/19/2023) 90 tablet 1   SHINGRIX injection Inject 0.5 mLs into the muscle once. (Patient not taking: Reported on 11/19/2023)     No current facility-administered medications for this visit.     REVIEW OF SYSTEMS:   Constitutional: Denies fevers, chills or night sweats Eyes: Denies blurriness of vision Ears, nose, mouth, throat, and face: Denies mucositis or sore throat Respiratory: Denies cough, dyspnea or wheezes Cardiovascular: Denies palpitation, chest discomfort or lower extremity swelling Gastrointestinal:  Denies nausea, heartburn or change in bowel habits Skin: Denies abnormal skin rashes Lymphatics: Denies new lymphadenopathy or easy bruising Neurological:Denies numbness, tingling or new weaknesses Behavioral/Psych: Mood is stable, no new changes  All other systems were reviewed with the patient and are negative.  PHYSICAL EXAMINATION:   Vitals:   11/19/23 0945 11/19/23 0951  BP: (!) 174/78 (!) 165/70  Pulse: 80   Resp: 18   Temp: 97.7 F (36.5 C)   SpO2: 96%     GENERAL:alert, no distress and comfortable SKIN: skin color, texture, turgor are normal, no rashes or  significant lesions LUNGS: clear to auscultation and percussion with normal breathing effort HEART: regular rate & rhythm and no murmurs and no lower extremity edema ABDOMEN:abdomen soft, non-tender and normal bowel sounds Musculoskeletal:no cyanosis of digits and no clubbing  NEURO: alert & oriented x 3 with fluent speech  LABORATORY DATA:  I have reviewed the data as listed  Lab Results  Component Value Date   WBC 7.3 11/19/2023   NEUTROABS 4.1 11/19/2023   HGB 16.8 11/19/2023   HCT 50.5 11/19/2023   MCV 92.2 11/19/2023   PLT 25 (LL) 11/19/2023      Chemistry      Component Value Date/Time   NA 142 11/19/2023 0852   K 3.9 11/19/2023 0852   CL 101 11/19/2023 0852   CO2 29 11/19/2023 0852   BUN 18 11/19/2023 0852   CREATININE 0.98 11/19/2023 0852      Component Value Date/Time   CALCIUM 9.7 11/19/2023 0852   ALKPHOS 73 11/19/2023 0852   AST 26 11/19/2023 0852   ALT 19 11/19/2023 0852   BILITOT 0.9 11/19/2023 0852       Latest Reference Range & Units 11/19/23 08:51  Iron 45 - 182 ug/dL 821  UIBC ug/dL 894  TIBC 250 - 450 ug/dL 716  Saturation Ratios 17.9 - 39.5 % 63 (H)  Ferritin 24 - 336 ng/mL 305  Folate >5.9 ng/mL 15.1  Vitamin B12 180 - 914 pg/mL 866  (H): Data is abnormally high  RADIOGRAPHIC STUDIES: I have personally reviewed the radiological images as listed and agreed with the findings in the report.  None new to review

## 2023-11-22 ENCOUNTER — Inpatient Hospital Stay

## 2023-11-22 DIAGNOSIS — Z791 Long term (current) use of non-steroidal anti-inflammatories (NSAID): Secondary | ICD-10-CM | POA: Diagnosis not present

## 2023-11-22 DIAGNOSIS — D72829 Elevated white blood cell count, unspecified: Secondary | ICD-10-CM

## 2023-11-22 DIAGNOSIS — Z7952 Long term (current) use of systemic steroids: Secondary | ICD-10-CM | POA: Diagnosis not present

## 2023-11-22 DIAGNOSIS — Z79899 Other long term (current) drug therapy: Secondary | ICD-10-CM | POA: Diagnosis not present

## 2023-11-22 DIAGNOSIS — D696 Thrombocytopenia, unspecified: Secondary | ICD-10-CM

## 2023-11-22 DIAGNOSIS — D693 Immune thrombocytopenic purpura: Secondary | ICD-10-CM | POA: Diagnosis not present

## 2023-11-22 DIAGNOSIS — E538 Deficiency of other specified B group vitamins: Secondary | ICD-10-CM | POA: Diagnosis not present

## 2023-11-22 DIAGNOSIS — Z7982 Long term (current) use of aspirin: Secondary | ICD-10-CM | POA: Diagnosis not present

## 2023-11-22 LAB — CBC WITH DIFFERENTIAL/PLATELET
Abs Immature Granulocytes: 0.1 K/uL — ABNORMAL HIGH (ref 0.00–0.07)
Basophils Absolute: 0 K/uL (ref 0.0–0.1)
Basophils Relative: 0 %
Eosinophils Absolute: 0 K/uL (ref 0.0–0.5)
Eosinophils Relative: 0 %
HCT: 49.2 % (ref 39.0–52.0)
Hemoglobin: 16.8 g/dL (ref 13.0–17.0)
Immature Granulocytes: 1 %
Lymphocytes Relative: 12 %
Lymphs Abs: 1.2 K/uL (ref 0.7–4.0)
MCH: 31.1 pg (ref 26.0–34.0)
MCHC: 34.1 g/dL (ref 30.0–36.0)
MCV: 90.9 fL (ref 80.0–100.0)
Monocytes Absolute: 0.6 K/uL (ref 0.1–1.0)
Monocytes Relative: 6 %
Neutro Abs: 7.5 K/uL (ref 1.7–7.7)
Neutrophils Relative %: 81 %
Platelets: 32 K/uL — ABNORMAL LOW (ref 150–400)
RBC: 5.41 MIL/uL (ref 4.22–5.81)
RDW: 13.3 % (ref 11.5–15.5)
WBC: 9.3 K/uL (ref 4.0–10.5)
nRBC: 0 % (ref 0.0–0.2)

## 2023-11-23 ENCOUNTER — Inpatient Hospital Stay

## 2023-11-23 VITALS — BP 154/81 | HR 85 | Temp 97.0°F | Resp 18 | Wt 160.0 lb

## 2023-11-23 DIAGNOSIS — E538 Deficiency of other specified B group vitamins: Secondary | ICD-10-CM | POA: Diagnosis not present

## 2023-11-23 DIAGNOSIS — Z7952 Long term (current) use of systemic steroids: Secondary | ICD-10-CM | POA: Diagnosis not present

## 2023-11-23 DIAGNOSIS — Z7982 Long term (current) use of aspirin: Secondary | ICD-10-CM | POA: Diagnosis not present

## 2023-11-23 DIAGNOSIS — Z79899 Other long term (current) drug therapy: Secondary | ICD-10-CM | POA: Diagnosis not present

## 2023-11-23 DIAGNOSIS — D693 Immune thrombocytopenic purpura: Secondary | ICD-10-CM | POA: Diagnosis not present

## 2023-11-23 DIAGNOSIS — Z791 Long term (current) use of non-steroidal anti-inflammatories (NSAID): Secondary | ICD-10-CM | POA: Diagnosis not present

## 2023-11-23 MED ORDER — ACETAMINOPHEN 325 MG PO TABS
650.0000 mg | ORAL_TABLET | Freq: Once | ORAL | Status: AC
  Administered 2023-11-23: 650 mg via ORAL
  Filled 2023-11-23: qty 2

## 2023-11-23 MED ORDER — DIPHENHYDRAMINE HCL 25 MG PO CAPS
25.0000 mg | ORAL_CAPSULE | Freq: Once | ORAL | Status: AC
  Administered 2023-11-23: 25 mg via ORAL
  Filled 2023-11-23: qty 1

## 2023-11-23 MED ORDER — IMMUNE GLOBULIN (HUMAN) 10 GM/100ML IV SOLN
70.0000 g | Freq: Once | INTRAVENOUS | Status: AC
  Administered 2023-11-23: 70 g via INTRAVENOUS
  Filled 2023-11-23: qty 600

## 2023-11-23 MED ORDER — FAMOTIDINE IN NACL 20-0.9 MG/50ML-% IV SOLN
20.0000 mg | Freq: Once | INTRAVENOUS | Status: AC
  Administered 2023-11-23: 20 mg via INTRAVENOUS
  Filled 2023-11-23: qty 50

## 2023-11-23 MED ORDER — DEXTROSE 5 % IV SOLN: INTRAVENOUS | Status: DC

## 2023-11-23 NOTE — Patient Instructions (Signed)
 [facility] [place] [facility]  Discharge Instructions: Thank you for choosing [facility] to provide your oncology and hematology care.  If you have a lab appointment with the Cancer Center - please note that after April 8th, 2024, all labs will be drawn in the cancer center.  You do not have to check in or register with the main entrance as you have in the past but will complete your check-in in the cancer center.  Wear comfortable clothing and clothing appropriate for easy access to any Portacath or PICC line.   We strive to give you quality time with your provider. You may need to reschedule your appointment if you arrive late (15 or more minutes).  Arriving late affects you and other patients whose appointments are after yours.  Also, if you miss three or more appointments without notifying the office, you may be dismissed from the clinic at the provider's discretion.      For prescription refill requests, have your pharmacy contact our office and allow 72 hours for refills to be completed.    Today you received the following chemotherapy and/or immunotherapy agents IVIG      To help prevent nausea and vomiting after your treatment, we encourage you to take your nausea medication as directed.  BELOW ARE SYMPTOMS THAT SHOULD BE REPORTED IMMEDIATELY: *FEVER GREATER THAN 100.4 F (38 C) OR HIGHER *CHILLS OR SWEATING *NAUSEA AND VOMITING THAT IS NOT CONTROLLED WITH YOUR NAUSEA MEDICATION *UNUSUAL SHORTNESS OF BREATH *UNUSUAL BRUISING OR BLEEDING *URINARY PROBLEMS (pain or burning when urinating, or frequent urination) *BOWEL PROBLEMS (unusual diarrhea, constipation, pain near the anus) TENDERNESS IN MOUTH AND THROAT WITH OR WITHOUT PRESENCE OF ULCERS (sore throat, sores in mouth, or a toothache) UNUSUAL RASH, SWELLING OR PAIN  UNUSUAL VAGINAL DISCHARGE OR ITCHING   Items with * indicate a potential emergency and should be followed up as  soon as possible or go to the Emergency Department if any problems should occur.  Please show the CHEMOTHERAPY ALERT CARD or IMMUNOTHERAPY ALERT CARD at check-in to the Emergency Department and triage nurse.  Should you have questions after your visit or need to cancel or reschedule your appointment, please contact [facility] [place] - A DEPT OF Eligha Bridegroom [facility] [phone]  and follow the prompts.  Office hours are 8:00 a.m. to 4:30 p.m. Monday - Friday. Please note that voicemails left after 4:00 p.m. may not be returned until the following business day.  We are closed weekends and major holidays. You have access to a nurse at all times for urgent questions. Please call the main number to the clinic [phone] and follow the prompts.  For any non-urgent questions, you may also contact your provider using MyChart. We now offer e-Visits for anyone 47 and older to request care online for non-urgent symptoms. For details visit [email or web address].   Also download the MyChart app! Go to the app store, search "MyChart", open the app, select [place], and log in with your MyChart username and password.

## 2023-11-23 NOTE — Progress Notes (Signed)
 Patient presents today for IVIG infusion per providers order.  Vital signs WNL.  Patient has no new complaints at this time.  Peripheral IV started and blood return noted pre and post infusion.  Stable during infusion without adverse affects.  Vital signs stable.  No complaints at this time.  Discharge from clinic ambulatory in stable condition.  Alert and oriented X 3.  Follow up with [facility] as scheduled.

## 2023-11-26 ENCOUNTER — Other Ambulatory Visit: Payer: Self-pay

## 2023-11-26 DIAGNOSIS — E538 Deficiency of other specified B group vitamins: Secondary | ICD-10-CM

## 2023-11-26 DIAGNOSIS — D696 Thrombocytopenia, unspecified: Secondary | ICD-10-CM

## 2023-11-29 ENCOUNTER — Inpatient Hospital Stay

## 2023-11-29 ENCOUNTER — Other Ambulatory Visit

## 2023-11-29 ENCOUNTER — Inpatient Hospital Stay (HOSPITAL_BASED_OUTPATIENT_CLINIC_OR_DEPARTMENT_OTHER): Admitting: Oncology

## 2023-11-29 VITALS — BP 165/79 | HR 82 | Temp 98.5°F | Resp 18 | Wt 165.0 lb

## 2023-11-29 DIAGNOSIS — D696 Thrombocytopenia, unspecified: Secondary | ICD-10-CM | POA: Diagnosis not present

## 2023-11-29 DIAGNOSIS — D693 Immune thrombocytopenic purpura: Secondary | ICD-10-CM | POA: Diagnosis not present

## 2023-11-29 DIAGNOSIS — Z7952 Long term (current) use of systemic steroids: Secondary | ICD-10-CM | POA: Diagnosis not present

## 2023-11-29 DIAGNOSIS — Z791 Long term (current) use of non-steroidal anti-inflammatories (NSAID): Secondary | ICD-10-CM | POA: Diagnosis not present

## 2023-11-29 DIAGNOSIS — E538 Deficiency of other specified B group vitamins: Secondary | ICD-10-CM | POA: Diagnosis not present

## 2023-11-29 DIAGNOSIS — Z7982 Long term (current) use of aspirin: Secondary | ICD-10-CM | POA: Diagnosis not present

## 2023-11-29 DIAGNOSIS — Z79899 Other long term (current) drug therapy: Secondary | ICD-10-CM | POA: Diagnosis not present

## 2023-11-29 LAB — CBC WITH DIFFERENTIAL/PLATELET
Abs Immature Granulocytes: 0.22 K/uL — ABNORMAL HIGH (ref 0.00–0.07)
Basophils Absolute: 0 K/uL (ref 0.0–0.1)
Basophils Relative: 0 %
Eosinophils Absolute: 0 K/uL (ref 0.0–0.5)
Eosinophils Relative: 0 %
HCT: 50.4 % (ref 39.0–52.0)
Hemoglobin: 16.5 g/dL (ref 13.0–17.0)
Immature Granulocytes: 2 %
Lymphocytes Relative: 14 %
Lymphs Abs: 1.5 K/uL (ref 0.7–4.0)
MCH: 30.6 pg (ref 26.0–34.0)
MCHC: 32.7 g/dL (ref 30.0–36.0)
MCV: 93.5 fL (ref 80.0–100.0)
Monocytes Absolute: 0.7 K/uL (ref 0.1–1.0)
Monocytes Relative: 7 %
Neutro Abs: 7.8 K/uL — ABNORMAL HIGH (ref 1.7–7.7)
Neutrophils Relative %: 77 %
Platelets: 118 K/uL — ABNORMAL LOW (ref 150–400)
RBC: 5.39 MIL/uL (ref 4.22–5.81)
RDW: 13.7 % (ref 11.5–15.5)
WBC: 10.2 K/uL (ref 4.0–10.5)
nRBC: 0 % (ref 0.0–0.2)

## 2023-11-29 NOTE — Progress Notes (Signed)
 [facility] at [facility]  HEMATOLOGY FOLLOW-UP VISIT  Frank Yancey LABOR, MD  REASON FOR FOLLOW-UP: Thrombocytopenia  ASSESSMENT & PLAN:  Patient is a 89 y.o. male following for thrombocytopenia   Assessment and Plan Assessment & Plan Idiopathic thrombocytopenic thrombocytopenia (ITP) Chronic thrombocytopenia for several years based on lab review at least since 2009, usual range is 100-120 Likely secondary to recent amoxicillin use and vitamin B12 deficiency.  ITP on the differential LDH and haptoglobin normal.  Normal iron and copper  level Hepatitis B&C negative Peripheral smear with no schistocytes and occasional small platelet clumps and big platelets Ultrasound abdomen showed no evidence of hepatosplenomegaly Patient did a trial of 4 days of dexamethasone  with no significant improvement No increased bleeding or bruising Did not improve with vitamin B12 supplementation. Started on prednisone taper on 11/19/2023.  S/p 1 dose of IVIG on 11/23/2023   - Platelets 118 today. - Continue prednisone taper.  Currently at 40 mg daily and will go down to 20 mg daily on Friday - Continue parenteral and oral vitamin B12 supplementation -If patient does not have an improvement, will consider bone marrow biopsy to rule out other causes of  Return to clinic in 10 days with labs  Vitamin B12 deficiency Ongoing treatment with B12 supplementation. Platelet count remains low, indicating B12 deficiency may not be the sole cause of thrombocytopenia.  - Continue B12 supplementation.   Orders Placed This Encounter  Procedures   CBC with Differential/Platelet    Standing Status:   Future    Expected Date:   12/08/2023    Expiration Date:   03/07/2024   Comprehensive metabolic panel with GFR    Standing Status:   Future    Expected Date:   12/08/2023    Expiration Date:   03/07/2024    The total time spent in the appointment was 20 minutes encounter with patients  including review of chart and various tests results, discussions about plan of care and coordination of care plan   All questions were answered. The patient knows to call the clinic with any problems, questions or concerns. No barriers to learning was detected.   [name], MD 10/13/202510:36 AM   SUMMARY OF HEMATOLOGIC HISTORY: Chronic thrombocytopenia with a platelet range of 100-120 at least since 2009 Recent drop to 17 Labs consistent with severe vitamin B12 deficiency Ultrasound abdomen showed no hepatosplenomegaly 11/19/2023: Started on prednisone 1 mg/kg with taper of 20 mg/week 11/23/2023: S/p 1 dose of IVIG  INTERVAL HISTORY:  [name] is an 89 year old male who presents for follow-up for ITP.  He is accompanied by his wife today.  He reports taking prednisone as prescribed but reports increased urination with that.  He denies any other complaints.  Overall is feeling well.  I have reviewed the past medical history, past surgical history, social history and family history with the patient   ALLERGIES:  has no known allergies.  MEDICATIONS:  Current Outpatient Medications  Medication Sig Dispense Refill   allopurinol (ZYLOPRIM) 300 MG tablet Take 1 tablet by mouth daily.     amLODipine  (NORVASC ) 10 MG tablet TAKE 1 TABLET BY MOUTH EVERY DAY 90 tablet 3   aspirin 81 MG tablet Take 81 mg by mouth daily.     cholecalciferol (VITAMIN D) 1000 UNITS tablet Take 2,000 Units by mouth daily.     cyanocobalamin  (VITAMIN B12) 1000 MCG tablet Take 1 tablet (1,000 mcg total) by mouth daily. 90 tablet 2  isosorbide mononitrate (IMDUR) 60 MG 24 hr tablet Take 60 mg by mouth daily. (Patient taking differently: Take 60 mg by mouth daily.)     lisinopril  (ZESTRIL ) 10 MG tablet TAKE 1 TABLET BY MOUTH EVERY DAY 90 tablet 3   meloxicam (MOBIC) 7.5 MG tablet Take 1 tablet by mouth daily.     nitroGLYCERIN  (NITROSTAT ) 0.4 MG SL tablet Place 1 tablet (0.4 mg total) under the  tongue every 5 (five) minutes as needed. 25 tablet 3   olmesartan (BENICAR) 5 MG tablet Take 5 mg by mouth daily.     potassium chloride SA (KLOR-CON M) 20 MEQ tablet Take 20 mEq by mouth daily.     pravastatin  (PRAVACHOL ) 40 MG tablet TAKE 1 TABLET BY MOUTH EVERY EVENING 90 tablet 1   predniSONE (DELTASONE) 10 MG tablet Take 6 tablets (60 mg total) by mouth daily with breakfast. Please take 60mg  x 1 week, Followed by 40mg  x 1 week, Followed by 20mg  x 1 week 90 tablet 0   SHINGRIX injection Inject 0.5 mLs into the muscle once.     No current facility-administered medications for this visit.     REVIEW OF SYSTEMS:   Constitutional: Denies fevers, chills or night sweats Eyes: Denies blurriness of vision Ears, nose, mouth, throat, and face: Denies mucositis or sore throat Respiratory: Denies cough, dyspnea or wheezes Cardiovascular: Denies palpitation, chest discomfort or lower extremity swelling Gastrointestinal:  Denies nausea, heartburn or change in bowel habits Skin: Denies abnormal skin rashes Lymphatics: Denies new lymphadenopathy or easy bruising Neurological:Denies numbness, tingling or new weaknesses Behavioral/Psych: Mood is stable, no new changes  All other systems were reviewed with the patient and are negative.  PHYSICAL EXAMINATION:   Vitals:   11/29/23 0824 11/29/23 0830  BP: (!) 186/89 (!) 165/79  Pulse: 82   Resp: 18   Temp: 98.5 F (36.9 C)   SpO2: 96%      GENERAL:alert, no distress and comfortable SKIN: skin color, texture, turgor are normal, no rashes or significant lesions LUNGS: clear to auscultation and percussion with normal breathing effort HEART: regular rate & rhythm and no murmurs and no lower extremity edema ABDOMEN:abdomen soft, non-tender and normal bowel sounds Musculoskeletal:no cyanosis of digits and no clubbing  NEURO: alert & oriented x 3 with fluent speech  LABORATORY DATA:  I have reviewed the data as listed  Lab Results  Component  Value Date   WBC 10.2 11/29/2023   NEUTROABS 7.8 (H) 11/29/2023   HGB 16.5 11/29/2023   HCT 50.4 11/29/2023   MCV 93.5 11/29/2023   PLT 118 (L) 11/29/2023      Chemistry      Component Value Date/Time   NA 142 11/19/2023 0852   K 3.9 11/19/2023 0852   CL 101 11/19/2023 0852   CO2 29 11/19/2023 0852   BUN 18 11/19/2023 0852   CREATININE 0.98 11/19/2023 0852      Component Value Date/Time   CALCIUM 9.7 11/19/2023 0852   ALKPHOS 73 11/19/2023 0852   AST 26 11/19/2023 0852   ALT 19 11/19/2023 0852   BILITOT 0.9 11/19/2023 0852       Latest Reference Range & Units 11/19/23 08:51  Iron 45 - 182 ug/dL 821  UIBC ug/dL 894  TIBC 749 - 549 ug/dL 716  Saturation Ratios 17.9 - 39.5 % 63 (H)  Ferritin 24 - 336 ng/mL 305  Folate >5.9 ng/mL 15.1  Vitamin B12 180 - 914 pg/mL 866  (H): Data is abnormally high  RADIOGRAPHIC STUDIES: I have personally reviewed the radiological images as listed and agreed with the findings in the report.  None new to review

## 2023-12-08 ENCOUNTER — Other Ambulatory Visit

## 2023-12-08 ENCOUNTER — Ambulatory Visit: Admitting: Oncology

## 2023-12-13 ENCOUNTER — Inpatient Hospital Stay

## 2023-12-13 ENCOUNTER — Inpatient Hospital Stay: Admitting: Oncology

## 2023-12-13 VITALS — BP 154/73 | HR 73 | Temp 97.8°F | Resp 18 | Wt 164.2 lb

## 2023-12-13 DIAGNOSIS — Z791 Long term (current) use of non-steroidal anti-inflammatories (NSAID): Secondary | ICD-10-CM | POA: Diagnosis not present

## 2023-12-13 DIAGNOSIS — D696 Thrombocytopenia, unspecified: Secondary | ICD-10-CM

## 2023-12-13 DIAGNOSIS — E538 Deficiency of other specified B group vitamins: Secondary | ICD-10-CM | POA: Diagnosis not present

## 2023-12-13 DIAGNOSIS — Z7952 Long term (current) use of systemic steroids: Secondary | ICD-10-CM | POA: Diagnosis not present

## 2023-12-13 DIAGNOSIS — D693 Immune thrombocytopenic purpura: Secondary | ICD-10-CM | POA: Diagnosis not present

## 2023-12-13 DIAGNOSIS — Z7982 Long term (current) use of aspirin: Secondary | ICD-10-CM | POA: Diagnosis not present

## 2023-12-13 DIAGNOSIS — Z79899 Other long term (current) drug therapy: Secondary | ICD-10-CM | POA: Diagnosis not present

## 2023-12-13 LAB — COMPREHENSIVE METABOLIC PANEL WITH GFR
ALT: 16 U/L (ref 0–44)
AST: 19 U/L (ref 15–41)
Albumin: 4.2 g/dL (ref 3.5–5.0)
Alkaline Phosphatase: 71 U/L (ref 38–126)
Anion gap: 9 (ref 5–15)
BUN: 21 mg/dL (ref 8–23)
CO2: 31 mmol/L (ref 22–32)
Calcium: 9 mg/dL (ref 8.9–10.3)
Chloride: 100 mmol/L (ref 98–111)
Creatinine, Ser: 1.12 mg/dL (ref 0.61–1.24)
GFR, Estimated: >60 mL/min (ref >60)
Glucose, Bld: 127 mg/dL — ABNORMAL HIGH (ref 70–99)
Potassium: 3.9 mmol/L (ref 3.5–5.1)
Sodium: 139 mmol/L (ref 135–145)
Total Bilirubin: 0.9 mg/dL (ref 0.0–1.2)
Total Protein: 7.1 g/dL (ref 6.5–8.1)

## 2023-12-13 LAB — CBC WITH DIFFERENTIAL/PLATELET
Abs Immature Granulocytes: 0.06 K/uL (ref 0.00–0.07)
Basophils Absolute: 0.1 K/uL (ref 0.0–0.1)
Basophils Relative: 1 %
Eosinophils Absolute: 0.6 K/uL — ABNORMAL HIGH (ref 0.0–0.5)
Eosinophils Relative: 5 %
HCT: 49.4 % (ref 39.0–52.0)
Hemoglobin: 16.6 g/dL (ref 13.0–17.0)
Immature Granulocytes: 1 %
Lymphocytes Relative: 18 %
Lymphs Abs: 1.8 K/uL (ref 0.7–4.0)
MCH: 31.4 pg (ref 26.0–34.0)
MCHC: 33.6 g/dL (ref 30.0–36.0)
MCV: 93.6 fL (ref 80.0–100.0)
Monocytes Absolute: 1.6 K/uL — ABNORMAL HIGH (ref 0.1–1.0)
Monocytes Relative: 15 %
Neutro Abs: 6.3 K/uL (ref 1.7–7.7)
Neutrophils Relative %: 60 %
Platelets: 30 K/uL — ABNORMAL LOW (ref 150–400)
RBC: 5.28 MIL/uL (ref 4.22–5.81)
RDW: 14.2 % (ref 11.5–15.5)
Smear Review: NORMAL
WBC: 10.4 K/uL (ref 4.0–10.5)
nRBC: 0 % (ref 0.0–0.2)

## 2023-12-13 MED ORDER — CYANOCOBALAMIN 1000 MCG/ML IJ SOLN
1000.0000 ug | Freq: Once | INTRAMUSCULAR | Status: AC
  Administered 2023-12-13: 1000 ug via INTRAMUSCULAR
  Filled 2023-12-13: qty 1

## 2023-12-13 NOTE — Patient Instructions (Signed)
 [facility] [place] [facility]  Discharge Instructions: Thank you for choosing [facility] to provide your oncology and hematology care.  If you have a lab appointment with the Cancer Center - please note that after April 8th, 2024, all labs will be drawn in the cancer center.  You do not have to check in or register with the main entrance as you have in the past but will complete your check-in in the cancer center.  Wear comfortable clothing and clothing appropriate for easy access to any Portacath or PICC line.   We strive to give you quality time with your provider. You may need to reschedule your appointment if you arrive late (15 or more minutes).  Arriving late affects you and other patients whose appointments are after yours.  Also, if you miss three or more appointments without notifying the office, you may be dismissed from the clinic at the provider's discretion.      For prescription refill requests, have your pharmacy contact our office and allow 72 hours for refills to be completed.    Today you received B12 injection     BELOW ARE SYMPTOMS THAT SHOULD BE REPORTED IMMEDIATELY: *FEVER GREATER THAN 100.4 F (38 C) OR HIGHER *CHILLS OR SWEATING *NAUSEA AND VOMITING THAT IS NOT CONTROLLED WITH YOUR NAUSEA MEDICATION *UNUSUAL SHORTNESS OF BREATH *UNUSUAL BRUISING OR BLEEDING *URINARY PROBLEMS (pain or burning when urinating, or frequent urination) *BOWEL PROBLEMS (unusual diarrhea, constipation, pain near the anus) TENDERNESS IN MOUTH AND THROAT WITH OR WITHOUT PRESENCE OF ULCERS (sore throat, sores in mouth, or a toothache) UNUSUAL RASH, SWELLING OR PAIN  UNUSUAL VAGINAL DISCHARGE OR ITCHING   Items with * indicate a potential emergency and should be followed up as soon as possible or go to the Emergency Department if any problems should occur.  Please show the CHEMOTHERAPY ALERT CARD or IMMUNOTHERAPY ALERT CARD at check-in to  the Emergency Department and triage nurse.  Should you have questions after your visit or need to cancel or reschedule your appointment, please contact [facility] [place] - A DEPT OF [facility]igha Bridegroom [facility] [phone]  and follow the prompts.  Office hours are 8:00 a.m. to 4:30 p.m. Monday - Friday. Please note that voicemails left after 4:00 p.m. may not be returned until the following business day.  We are closed weekends and major holidays. You have access to a nurse at all times for urgent questions. Please call the main number to the clinic [phone] and follow the prompts.  For any non-urgent questions, you may also contact your provider using MyChart. We now offer e-Visits for anyone 57 and older to request care online for non-urgent symptoms. For details visit [email or web address].   Also download the MyChart app! Go to the app store, search "MyChart", open the app, select [place], and log in with your MyChart username and password.

## 2023-12-13 NOTE — Progress Notes (Signed)
 [facility] at [facility]  HEMATOLOGY FOLLOW-UP VISIT  Frank Yancey LABOR, MD  REASON FOR FOLLOW-UP: Thrombocytopenia  ASSESSMENT & PLAN:  Patient is a 89 y.o. male following for thrombocytopenia   Assessment and Plan  Idiopathic thrombocytopenic thrombocytopenia (ITP) Chronic thrombocytopenia for several years based on lab review at least since 2009, usual range is 100-120 Likely secondary to recent amoxicillin use and vitamin B12 deficiency.  ITP on the differential LDH and haptoglobin normal.  Normal iron and copper  level Hepatitis B&C negative Peripheral smear with no schistocytes and occasional small platelet clumps and big platelets Ultrasound abdomen showed no evidence of hepatosplenomegaly Patient did a trial of 4 days of dexamethasone  with no significant improvement No increased bleeding or bruising Did not improve with vitamin B12 supplementation. Started on prednisone taper on 11/19/2023 and completed on 12/09/2023.  S/p 1 dose of IVIG on 11/23/2023 with good response Good response to IVIG strengthens ITP diagnosis   - Platelets 30 today.  Patient denies any petechia or purpura today. -Discussed holding off treatment at this time.  If patient's platelets drop below 20, can consider starting oral TPO like Promacta or doing a trial of weekly rituximab X4 doses -Patient at this time prefers to be off any kind of treatment as long as possible. - Continue parenteral and oral vitamin B12 supplementation -If patient does not have an improvement, will consider bone marrow biopsy to rule out other causes of thrombocytopenia  Return to clinic in 1 month with labs.  Vitamin B12 deficiency Ongoing treatment with B12 supplementation. Platelet count remains low, indicating B12 deficiency may not be the sole cause of thrombocytopenia.  - Continue B12 supplementation.  Orders Placed This Encounter  Procedures   CBC with Differential/Platelet    Standing  Status:   Future    Expected Date:   01/10/2024    Expiration Date:   04/09/2024   Comprehensive metabolic panel with GFR    Standing Status:   Future    Expected Date:   01/10/2024    Expiration Date:   04/09/2024    The total time spent in the appointment was 20 minutes encounter with patients including review of chart and various tests results, discussions about plan of care and coordination of care plan   All questions were answered. The patient knows to call the clinic with any problems, questions or concerns. No barriers to learning was detected.  I, [name], acting as a neurosurgeon for [employer], MD.,have documented all relevant documentation on the behalf of Frank Dry, MD,as directed by  Frank Dry, MD while in the presence of Frank Dry, MD.  I, Frank Dry MD, have reviewed the above documentation for accuracy and completeness, and I agree with the above.    Frank Dry, MD 10/27/202510:42 PM   SUMMARY OF HEMATOLOGIC HISTORY: Chronic thrombocytopenia with a platelet range of 100-120 at least since 2009 Recent drop to 17 Labs consistent with severe vitamin B12 deficiency Ultrasound abdomen showed no hepatosplenomegaly 11/19/2023- 12/09/2023: Started on prednisone 1 mg/kg with taper of 20 mg/week 11/23/2023: S/p 1 dose of IVIG  INTERVAL HISTORY:  [name] is an 89 year old male who presents for follow-up for ITP.  He is unaccompanied today.  [name] said that he finished his steroid medication on this past Thursday, Oct. 23rd. He mentioned that he still feels grumpy from taking the steroid medication.  He has no other complaints today.  He denies bleeding, petechiae and reported he  clots significantly really fast.  He had a dental clip put in with no complications. He is still taking vitamin B12 supplements.   I have reviewed the past medical history, past surgical history, social history and family history with the patient    ALLERGIES:  has no known allergies.  MEDICATIONS:  Current Outpatient Medications  Medication Sig Dispense Refill   cholecalciferol (VITAMIN D) 1000 UNITS tablet Take 2,000 Units by mouth daily.     cyanocobalamin  (VITAMIN B12) 1000 MCG tablet Take 1 tablet (1,000 mcg total) by mouth daily. 90 tablet 2   nitroGLYCERIN  (NITROSTAT ) 0.4 MG SL tablet Place 1 tablet (0.4 mg total) under the tongue every 5 (five) minutes as needed. 25 tablet 3   olmesartan (BENICAR) 5 MG tablet Take 5 mg by mouth daily.     allopurinol (ZYLOPRIM) 300 MG tablet Take 1 tablet by mouth daily. (Patient not taking: Reported on 12/13/2023)     amLODipine  (NORVASC ) 10 MG tablet TAKE 1 TABLET BY MOUTH EVERY DAY (Patient not taking: Reported on 12/13/2023) 90 tablet 3   aspirin 81 MG tablet Take 81 mg by mouth daily. (Patient not taking: Reported on 12/13/2023)     isosorbide mononitrate (IMDUR) 60 MG 24 hr tablet Take 60 mg by mouth daily. (Patient not taking: Reported on 12/13/2023)     lisinopril  (ZESTRIL ) 10 MG tablet TAKE 1 TABLET BY MOUTH EVERY DAY (Patient not taking: Reported on 12/13/2023) 90 tablet 3   meloxicam (MOBIC) 7.5 MG tablet Take 1 tablet by mouth daily. (Patient not taking: Reported on 12/13/2023)     potassium chloride SA (KLOR-CON M) 20 MEQ tablet Take 20 mEq by mouth daily. (Patient not taking: Reported on 12/13/2023)     pravastatin  (PRAVACHOL ) 40 MG tablet TAKE 1 TABLET BY MOUTH EVERY EVENING (Patient not taking: Reported on 12/13/2023) 90 tablet 1   predniSONE (DELTASONE) 10 MG tablet Take 6 tablets (60 mg total) by mouth daily with breakfast. Please take 60mg  x 1 week, Followed by 40mg  x 1 week, Followed by 20mg  x 1 week (Patient not taking: Reported on 12/13/2023) 90 tablet 0   SHINGRIX injection Inject 0.5 mLs into the muscle once.     No current facility-administered medications for this visit.     REVIEW OF SYSTEMS:   Constitutional: Denies fevers, chills or night sweats Eyes: Denies  blurriness of vision Ears, nose, mouth, throat, and face: Denies mucositis or sore throat Respiratory: Denies cough, dyspnea or wheezes Cardiovascular: Denies palpitation, chest discomfort or lower extremity swelling Gastrointestinal:  Denies nausea, heartburn or change in bowel habits Skin: Denies abnormal skin rashes Lymphatics: Denies new lymphadenopathy or easy bruising Neurological:Denies numbness, tingling or new weaknesses Behavioral/Psych: Mood is stable, no new changes  All other systems were reviewed with the patient and are negative.  PHYSICAL EXAMINATION:   Vitals:   12/13/23 0831 12/13/23 0836  BP: (!) 159/75 (!) 154/73  Pulse: 73   Resp: 18   Temp: 97.8 F (36.6 C)   SpO2: 95%     GENERAL:alert, no distress and comfortable Oral cavity: No petechia or purpura seen. SKIN: skin color, texture, turgor are normal, no rashes or significant lesions LUNGS: clear to auscultation and percussion with normal breathing effort HEART: regular rate & rhythm and no murmurs and no lower extremity edema ABDOMEN:abdomen soft, non-tender and normal bowel sounds Musculoskeletal:no cyanosis of digits and no clubbing  NEURO: alert & oriented x 3 with fluent speech  LABORATORY DATA:  I have reviewed the data  as listed  Lab Results  Component Value Date   WBC 10.4 12/13/2023   NEUTROABS 6.3 12/13/2023   HGB 16.6 12/13/2023   HCT 49.4 12/13/2023   MCV 93.6 12/13/2023   PLT 30 (L) 12/13/2023      Chemistry      Component Value Date/Time   NA 139 12/13/2023 0737   K 3.9 12/13/2023 0737   CL 100 12/13/2023 0737   CO2 31 12/13/2023 0737   BUN 21 12/13/2023 0737   CREATININE 1.12 12/13/2023 0737      Component Value Date/Time   CALCIUM 9.0 12/13/2023 0737   ALKPHOS 71 12/13/2023 0737   AST 19 12/13/2023 0737   ALT 16 12/13/2023 0737   BILITOT 0.9 12/13/2023 0737       Latest Reference Range & Units 11/19/23 08:51  Iron 45 - 182 ug/dL 821  UIBC ug/dL 894  TIBC 749 -  549 ug/dL 716  Saturation Ratios 17.9 - 39.5 % 63 (H)  Ferritin 24 - 336 ng/mL 305  Folate >5.9 ng/mL 15.1  Vitamin B12 180 - 914 pg/mL 866  (H): Data is abnormally high  RADIOGRAPHIC STUDIES: I have personally reviewed the radiological images as listed and agreed with the findings in the report.  None new to review

## 2023-12-13 NOTE — Patient Instructions (Signed)
 [facility] at [facility] Discharge Instructions   You were seen and examined today by Dr. Davonna.  She reviewed the results of your lab work which are normal/stable.   We will see you back in one month.   Return as scheduled.    Thank you for choosing [facility] at [facility] to provide your oncology and hematology care.  To afford each patient quality time with our provider, please arrive at least 15 minutes before your scheduled appointment time.   If you have a lab appointment with the Cancer Center please come in thru the Main Entrance and check in at the main information desk.  You need to re-schedule your appointment should you arrive 10 or more minutes late.  We strive to give you quality time with our providers, and arriving late affects you and other patients whose appointments are after yours.  Also, if you no show three or more times for appointments you may be dismissed from the clinic at the providers discretion.     Again, thank you for choosing [facility].  Our hope is that these requests will decrease the amount of time that you wait before being seen by our physicians.       _____________________________________________________________  Should you have questions after your visit to [facility], please contact our office at [phone] and follow the prompts.  Our office hours are 8:00 a.m. and 4:30 p.m. Monday - Friday.  Please note that voicemails left after 4:00 p.m. may not be returned until the following business day.  We are closed weekends and major holidays.  You do have access to a nurse 24-7, just call the main number to the clinic [phone] and do not press any options, hold on the line and a nurse will answer the phone.    For prescription refill requests, have your pharmacy contact our office and allow 72 hours.    Due to Covid, you will need to wear a mask upon entering the  hospital. If you do not have a mask, a mask will be given to you at the Main Entrance upon arrival. For doctor visits, patients may have 1 support person age 90 or older with them. For treatment visits, patients can not have anyone with them due to social distancing guidelines and our immunocompromised population.

## 2023-12-13 NOTE — Progress Notes (Signed)
 [name] presents today for injection per the provider's orders.  B12 administration without incident; injection site WNL; see MAR for injection details.  Patient tolerated procedure well and without incident.  No questions or complaints noted at this time.   Discharged from clinic ambulatory in stable condition. Alert and oriented x 3. F/U with [facility] as scheduled.

## 2024-01-12 ENCOUNTER — Inpatient Hospital Stay (HOSPITAL_BASED_OUTPATIENT_CLINIC_OR_DEPARTMENT_OTHER): Admitting: Oncology

## 2024-01-12 ENCOUNTER — Inpatient Hospital Stay

## 2024-01-12 ENCOUNTER — Inpatient Hospital Stay: Attending: Oncology

## 2024-01-12 VITALS — BP 177/78 | HR 78 | Temp 98.3°F | Resp 18 | Ht 70.0 in | Wt 161.0 lb

## 2024-01-12 DIAGNOSIS — D696 Thrombocytopenia, unspecified: Secondary | ICD-10-CM

## 2024-01-12 DIAGNOSIS — E538 Deficiency of other specified B group vitamins: Secondary | ICD-10-CM | POA: Diagnosis not present

## 2024-01-12 DIAGNOSIS — D693 Immune thrombocytopenic purpura: Secondary | ICD-10-CM | POA: Diagnosis not present

## 2024-01-12 LAB — CBC WITH DIFFERENTIAL/PLATELET
Abs Immature Granulocytes: 0.03 K/uL (ref 0.00–0.07)
Basophils Absolute: 0 K/uL (ref 0.0–0.1)
Basophils Relative: 0 %
Eosinophils Absolute: 0.3 K/uL (ref 0.0–0.5)
Eosinophils Relative: 4 %
HCT: 49.7 % (ref 39.0–52.0)
Hemoglobin: 16.7 g/dL (ref 13.0–17.0)
Immature Granulocytes: 0 %
Lymphocytes Relative: 31 %
Lymphs Abs: 2.1 K/uL (ref 0.7–4.0)
MCH: 30.3 pg (ref 26.0–34.0)
MCHC: 33.6 g/dL (ref 30.0–36.0)
MCV: 90 fL (ref 80.0–100.0)
Monocytes Absolute: 1 K/uL (ref 0.1–1.0)
Monocytes Relative: 15 %
Neutro Abs: 3.4 K/uL (ref 1.7–7.7)
Neutrophils Relative %: 50 %
Platelets: 35 K/uL — ABNORMAL LOW (ref 150–400)
RBC: 5.52 MIL/uL (ref 4.22–5.81)
RDW: 13.6 % (ref 11.5–15.5)
WBC: 6.8 K/uL (ref 4.0–10.5)
nRBC: 0 % (ref 0.0–0.2)

## 2024-01-12 LAB — COMPREHENSIVE METABOLIC PANEL WITH GFR
ALT: 16 U/L (ref 0–44)
AST: 23 U/L (ref 15–41)
Albumin: 4.4 g/dL (ref 3.5–5.0)
Alkaline Phosphatase: 75 U/L (ref 38–126)
Anion gap: 9 (ref 5–15)
BUN: 18 mg/dL (ref 8–23)
CO2: 32 mmol/L (ref 22–32)
Calcium: 9.5 mg/dL (ref 8.9–10.3)
Chloride: 101 mmol/L (ref 98–111)
Creatinine, Ser: 0.88 mg/dL (ref 0.61–1.24)
GFR, Estimated: >60 mL/min (ref >60)
Glucose, Bld: 96 mg/dL (ref 70–99)
Potassium: 4 mmol/L (ref 3.5–5.1)
Sodium: 142 mmol/L (ref 135–145)
Total Bilirubin: 0.7 mg/dL (ref 0.0–1.2)
Total Protein: 7.2 g/dL (ref 6.5–8.1)

## 2024-01-12 MED ORDER — CYANOCOBALAMIN 1000 MCG/ML IJ SOLN
1000.0000 ug | Freq: Once | INTRAMUSCULAR | Status: AC
  Administered 2024-01-12: 1000 ug via INTRAMUSCULAR
  Filled 2024-01-12: qty 1

## 2024-01-12 NOTE — Progress Notes (Signed)
 [facility] at [facility]  HEMATOLOGY FOLLOW-UP VISIT  [name], MD  REASON FOR FOLLOW-UP: Thrombocytopenia  ASSESSMENT & PLAN:  Patient is a 89 y.o. male following for thrombocytopenia   Assessment and Plan  Idiopathic thrombocytopenic thrombocytopenia (ITP) Chronic thrombocytopenia for several years based on lab review at least since 2009, usual range is 100-120 Likely secondary to recent amoxicillin use and vitamin B12 deficiency.  ITP on the differential LDH and haptoglobin normal.  Normal iron and copper  level Hepatitis B&C negative Peripheral smear with no schistocytes and occasional small platelet clumps and big platelets Ultrasound abdomen showed no evidence of hepatosplenomegaly Patient did a trial of 4 days of dexamethasone  with no significant improvement No increased bleeding or bruising Did not improve with vitamin B12 supplementation. Started on prednisone  taper on 11/19/2023 and completed on 12/09/2023.  S/p 1 dose of IVIG on 11/23/2023 with good response Good response to IVIG strengthens ITP diagnosis   - Platelets 35 today.  Patient denies any petechia or purpura today. - Continue to hold off treatment at this time.  If patient's platelets drop below 20, can consider starting oral TPO like Promacta or doing a trial of weekly rituximab X 4 doses -Patient at this time prefers to be off any kind of treatment as long as possible. - Continue parenteral and oral vitamin B12 supplementation - Recommended patient to go to the ER with headache, petechia or purpura.  Return to clinic in 2 months with labs.  Vitamin B12 deficiency Ongoing treatment with B12 supplementation. Platelet count remains low, indicating B12 deficiency may not be the sole cause of thrombocytopenia.  - Continue oral and parenteral B12 supplementation.  Orders Placed This Encounter  Procedures   Ferritin    Standing Status:   Future    Expected Date:   03/06/2024     Expiration Date:   06/04/2024   Folate    Standing Status:   Future    Expected Date:   03/06/2024    Expiration Date:   06/04/2024   Vitamin B12    Standing Status:   Future    Expected Date:   03/06/2024    Expiration Date:   06/04/2024   Comprehensive metabolic panel with GFR    Standing Status:   Future    Expected Date:   03/06/2024    Expiration Date:   06/04/2024   CBC with Differential/Platelet    Standing Status:   Future    Expected Date:   03/06/2024    Expiration Date:   06/04/2024   Iron and TIBC    Standing Status:   Future    Expected Date:   03/06/2024    Expiration Date:   06/04/2024    The total time spent in the appointment was 20 minutes encounter with patients including review of chart and various tests results, discussions about plan of care and coordination of care plan   All questions were answered. The patient knows to call the clinic with any problems, questions or concerns. No barriers to learning was detected.   [name],acting as a neurosurgeon for [name], MD.,have documented all relevant documentation on the behalf of [name], MD,as directed by  [name], MD while in the presence of [name], MD.  I, [name] MD, have reviewed the above documentation for accuracy and completeness, and I agree with the above.      [name], MD 11/26/202512:56 PM   SUMMARY OF HEMATOLOGIC HISTORY:  Chronic thrombocytopenia with a platelet range of 100-120 at least since 2009 Recent drop to 17 Labs consistent with severe vitamin B12 deficiency Ultrasound abdomen showed no hepatosplenomegaly 11/19/2023- 12/09/2023: Started on prednisone  1 mg/kg with taper of 20 mg/week 11/23/2023: S/p 1 dose of IVIG  INTERVAL HISTORY:  [name] is an 89 year old male who presents for follow-up for ITP.  He is unaccompanied today.  We discussed labs today, including his improving platelets. He denies any bleeding issues, mouth  sores, fevers, chills, or night sweats. He is taking vitamin B12 as prescribed. He is excited about thanksgiving prep and feels very well.     I have reviewed the past medical history, past surgical history, social history and family history with the patient   ALLERGIES:  has no known allergies.  MEDICATIONS:  Current Outpatient Medications  Medication Sig Dispense Refill   allopurinol (ZYLOPRIM) 300 MG tablet Take 1 tablet by mouth daily. (Patient not taking: Reported on 12/13/2023)     amLODipine  (NORVASC ) 10 MG tablet TAKE 1 TABLET BY MOUTH EVERY DAY (Patient not taking: Reported on 12/13/2023) 90 tablet 3   aspirin 81 MG tablet Take 81 mg by mouth daily. (Patient not taking: Reported on 12/13/2023)     cholecalciferol (VITAMIN D) 1000 UNITS tablet Take 2,000 Units by mouth daily.     cyanocobalamin  (VITAMIN B12) 1000 MCG tablet Take 1 tablet (1,000 mcg total) by mouth daily. 90 tablet 2   isosorbide mononitrate (IMDUR) 60 MG 24 hr tablet Take 60 mg by mouth daily. (Patient not taking: Reported on 12/13/2023)     lisinopril  (ZESTRIL ) 10 MG tablet TAKE 1 TABLET BY MOUTH EVERY DAY (Patient not taking: Reported on 12/13/2023) 90 tablet 3   meloxicam (MOBIC) 7.5 MG tablet Take 1 tablet by mouth daily. (Patient not taking: Reported on 12/13/2023)     nitroGLYCERIN  (NITROSTAT ) 0.4 MG SL tablet Place 1 tablet (0.4 mg total) under the tongue every 5 (five) minutes as needed. 25 tablet 3   olmesartan (BENICAR) 5 MG tablet Take 5 mg by mouth daily.     potassium chloride SA (KLOR-CON M) 20 MEQ tablet Take 20 mEq by mouth daily. (Patient not taking: Reported on 12/13/2023)     pravastatin  (PRAVACHOL ) 40 MG tablet TAKE 1 TABLET BY MOUTH EVERY EVENING (Patient not taking: Reported on 12/13/2023) 90 tablet 1   SHINGRIX injection Inject 0.5 mLs into the muscle once.     No current facility-administered medications for this visit.     REVIEW OF SYSTEMS:   Constitutional: Denies fevers, chills or night  sweats Eyes: Denies blurriness of vision Ears, nose, mouth, throat, and face: Denies mucositis or sore throat Respiratory: Denies cough, dyspnea or wheezes Cardiovascular: Denies palpitation, chest discomfort or lower extremity swelling Gastrointestinal:  Denies nausea, heartburn or change in bowel habits Skin: Denies abnormal skin rashes Lymphatics: Denies new lymphadenopathy or easy bruising Neurological:Denies numbness, tingling or new weaknesses Behavioral/Psych: Mood is stable, no new changes  All other systems were reviewed with the patient and are negative.  PHYSICAL EXAMINATION:   Vitals:   01/12/24 1005 01/12/24 1016  BP: (!) 183/81 (!) 177/78  Pulse: 78   Resp: 18   Temp: 98.3 F (36.8 C)   SpO2: 96%      GENERAL:alert, no distress and comfortable Oral cavity: No petechia or purpura seen. SKIN: skin color, texture, turgor are normal, no rashes or significant lesions LUNGS: clear to auscultation and percussion with normal breathing effort HEART:  regular rate & rhythm and no murmurs and no lower extremity edema ABDOMEN:abdomen soft, non-tender and normal bowel sounds Musculoskeletal:no cyanosis of digits and no clubbing  NEURO: alert & oriented x 3 with fluent speech  LABORATORY DATA:  I have reviewed the data as listed  Lab Results  Component Value Date   WBC 6.8 01/12/2024   NEUTROABS 3.4 01/12/2024   HGB 16.7 01/12/2024   HCT 49.7 01/12/2024   MCV 90.0 01/12/2024   PLT 35 (L) 01/12/2024      Chemistry      Component Value Date/Time   NA 142 01/12/2024 0821   K 4.0 01/12/2024 0821   CL 101 01/12/2024 0821   CO2 32 01/12/2024 0821   BUN 18 01/12/2024 0821   CREATININE 0.88 01/12/2024 0821      Component Value Date/Time   CALCIUM 9.5 01/12/2024 0821   ALKPHOS 75 01/12/2024 0821   AST 23 01/12/2024 0821   ALT 16 01/12/2024 0821   BILITOT 0.7 01/12/2024 0821       Latest Reference Range & Units 11/19/23 08:51  Iron 45 - 182 ug/dL 821  UIBC  ug/dL 894  TIBC 749 - 549 ug/dL 716  Saturation Ratios 17.9 - 39.5 % 63 (H)  Ferritin 24 - 336 ng/mL 305  Folate >5.9 ng/mL 15.1  Vitamin B12 180 - 914 pg/mL 866  (H): Data is abnormally high  RADIOGRAPHIC STUDIES: I have personally reviewed the radiological images as listed and agreed with the findings in the report.  None new to review

## 2024-01-12 NOTE — Patient Instructions (Signed)

## 2024-01-12 NOTE — Progress Notes (Signed)
 [name] Sek presents today for B12 injection per the provider's orders.  Stable during administration without incident; injection site WNL; see MAR for injection details.  Patient tolerated procedure well and without incident.  No questions or complaints noted at this time.

## 2024-01-12 NOTE — Patient Instructions (Signed)
 [facility] at [facility] Discharge Instructions   You were seen and examined today by Dr. Davonna.  She reviewed the results of your lab work which are normal/stable. Your platelet count today is 35.   We will see you back in 2 months. We will repeat lab work at that time.    Return as scheduled.    Thank you for choosing  Cancer Center at [facility] to provide your oncology and hematology care.  To afford each patient quality time with our provider, please arrive at least 15 minutes before your scheduled appointment time.   If you have a lab appointment with the Cancer Center please come in thru the Main Entrance and check in at the main information desk.  You need to re-schedule your appointment should you arrive 10 or more minutes late.  We strive to give you quality time with our providers, and arriving late affects you and other patients whose appointments are after yours.  Also, if you no show three or more times for appointments you may be dismissed from the clinic at the providers discretion.     Again, thank you for choosing [facility].  Our hope is that these requests will decrease the amount of time that you wait before being seen by our physicians.       _____________________________________________________________  Should you have questions after your visit to [facility], please contact our office at [phone] and follow the prompts.  Our office hours are 8:00 a.m. and 4:30 p.m. Monday - Friday.  Please note that voicemails left after 4:00 p.m. may not be returned until the following business day.  We are closed weekends and major holidays.  You do have access to a nurse 24-7, just call the main number to the clinic [phone] and do not press any options, hold on the line and a nurse will answer the phone.    For prescription refill requests, have your pharmacy contact our office and allow 72 hours.     Due to Covid, you will need to wear a mask upon entering the hospital. If you do not have a mask, a mask will be given to you at the Main Entrance upon arrival. For doctor visits, patients may have 1 support person age 36 or older with them. For treatment visits, patients can not have anyone with them due to social distancing guidelines and our immunocompromised population.

## 2024-02-09 ENCOUNTER — Inpatient Hospital Stay: Attending: Oncology

## 2024-02-09 VITALS — BP 175/52 | HR 76 | Temp 97.5°F | Resp 18

## 2024-02-09 DIAGNOSIS — E538 Deficiency of other specified B group vitamins: Secondary | ICD-10-CM | POA: Diagnosis present

## 2024-02-09 MED ORDER — CYANOCOBALAMIN 1000 MCG/ML IJ SOLN
1000.0000 ug | Freq: Once | INTRAMUSCULAR | Status: AC
  Administered 2024-02-09: 1000 ug via INTRAMUSCULAR
  Filled 2024-02-09: qty 1

## 2024-02-09 NOTE — Progress Notes (Signed)
 [name] presents today for injection per the provider's orders.  Vitamin B12 administration without incident; injection site WNL; see MAR for injection details.  Patient tolerated procedure well and without incident.  No questions or complaints noted at this time. Patient Discharged ambulatory and in stable condition.

## 2024-02-09 NOTE — Patient Instructions (Signed)
 [facility] [place] - [facility]  Discharge Instructions: Thank you for choosing [facility] to provide your oncology and hematology care.  If you have a lab appointment with the Cancer Center - please note that after April 8th, 2024, all labs will be drawn in the cancer center.  You do not have to check in or register with the main entrance as you have in the past but will complete your check-in in the cancer center.  Wear comfortable clothing and clothing appropriate for easy access to any Portacath or PICC line.   We strive to give you quality time with your provider. You may need to reschedule your appointment if you arrive late (15 or more minutes).  Arriving late affects you and other patients whose appointments are after yours.  Also, if you miss three or more appointments without notifying the office, you may be dismissed from the clinic at the provider's discretion.      For prescription refill requests, have your pharmacy contact our office and allow 72 hours for refills to be completed.    Today you received the following Vitamin B12 injection.      To help prevent nausea and vomiting after your treatment, we encourage you to take your nausea medication as directed.  BELOW ARE SYMPTOMS THAT SHOULD BE REPORTED IMMEDIATELY: *FEVER GREATER THAN 100.4 F (38 C) OR HIGHER *CHILLS OR SWEATING *NAUSEA AND VOMITING THAT IS NOT CONTROLLED WITH YOUR NAUSEA MEDICATION *UNUSUAL SHORTNESS OF BREATH *UNUSUAL BRUISING OR BLEEDING *URINARY PROBLEMS (pain or burning when urinating, or frequent urination) *BOWEL PROBLEMS (unusual diarrhea, constipation, pain near the anus) TENDERNESS IN MOUTH AND THROAT WITH OR WITHOUT PRESENCE OF ULCERS (sore throat, sores in mouth, or a toothache) UNUSUAL RASH, SWELLING OR PAIN  UNUSUAL VAGINAL DISCHARGE OR ITCHING   Items with * indicate a potential emergency and should be followed up as soon as possible or go  to the Emergency Department if any problems should occur.  Please show the CHEMOTHERAPY ALERT CARD or IMMUNOTHERAPY ALERT CARD at check-in to the Emergency Department and triage nurse.  Should you have questions after your visit or need to cancel or reschedule your appointment, please contact [facility] [place] - A DEPT OF Eligha Bridegroom [facility] [phone]  and follow the prompts.  Office hours are 8:00 a.m. to 4:30 p.m. Monday - Friday. Please note that voicemails left after 4:00 p.m. may not be returned until the following business day.  We are closed weekends and major holidays. You have access to a nurse at all times for urgent questions. Please call the main number to the clinic [phone] and follow the prompts.  For any non-urgent questions, you may also contact your provider using MyChart. We now offer e-Visits for anyone 63 and older to request care online for non-urgent symptoms. For details visit [email or web address].   Also download the MyChart app! Go to the app store, search "MyChart", open the app, select [place], and log in with your MyChart username and password.

## 2024-02-11 ENCOUNTER — Inpatient Hospital Stay

## 2024-02-14 ENCOUNTER — Encounter: Payer: Self-pay | Admitting: *Deleted

## 2024-03-10 ENCOUNTER — Inpatient Hospital Stay

## 2024-03-10 ENCOUNTER — Inpatient Hospital Stay: Attending: Oncology

## 2024-03-10 VITALS — BP 164/75 | HR 72

## 2024-03-10 DIAGNOSIS — E538 Deficiency of other specified B group vitamins: Secondary | ICD-10-CM

## 2024-03-10 DIAGNOSIS — D696 Thrombocytopenia, unspecified: Secondary | ICD-10-CM

## 2024-03-10 LAB — CBC WITH DIFFERENTIAL/PLATELET
Abs Immature Granulocytes: 0.05 K/uL (ref 0.00–0.07)
Basophils Absolute: 0 K/uL (ref 0.0–0.1)
Basophils Relative: 0 %
Eosinophils Absolute: 0.1 K/uL (ref 0.0–0.5)
Eosinophils Relative: 1 %
HCT: 49.5 % (ref 39.0–52.0)
Hemoglobin: 16.4 g/dL (ref 13.0–17.0)
Immature Granulocytes: 1 %
Lymphocytes Relative: 29 %
Lymphs Abs: 2.1 K/uL (ref 0.7–4.0)
MCH: 30.4 pg (ref 26.0–34.0)
MCHC: 33.1 g/dL (ref 30.0–36.0)
MCV: 91.7 fL (ref 80.0–100.0)
Monocytes Absolute: 0.9 K/uL (ref 0.1–1.0)
Monocytes Relative: 13 %
Neutro Abs: 3.9 K/uL (ref 1.7–7.7)
Neutrophils Relative %: 56 %
Platelets: 26 K/uL — CL (ref 150–400)
RBC: 5.4 MIL/uL (ref 4.22–5.81)
RDW: 13.5 % (ref 11.5–15.5)
WBC: 7 K/uL (ref 4.0–10.5)
nRBC: 0 % (ref 0.0–0.2)

## 2024-03-10 LAB — COMPREHENSIVE METABOLIC PANEL WITH GFR
ALT: 14 U/L (ref 0–44)
AST: 22 U/L (ref 15–41)
Albumin: 4.7 g/dL (ref 3.5–5.0)
Alkaline Phosphatase: 83 U/L (ref 38–126)
Anion gap: 14 (ref 5–15)
BUN: 13 mg/dL (ref 8–23)
CO2: 24 mmol/L (ref 22–32)
Calcium: 9.4 mg/dL (ref 8.9–10.3)
Chloride: 101 mmol/L (ref 98–111)
Creatinine, Ser: 0.92 mg/dL (ref 0.61–1.24)
GFR, Estimated: >60 mL/min
Glucose, Bld: 93 mg/dL (ref 70–99)
Potassium: 4.6 mmol/L (ref 3.5–5.1)
Sodium: 140 mmol/L (ref 135–145)
Total Bilirubin: 0.7 mg/dL (ref 0.0–1.2)
Total Protein: 7.5 g/dL (ref 6.5–8.1)

## 2024-03-10 LAB — IRON AND TIBC
Iron: 155 ug/dL (ref 45–182)
Saturation Ratios: 54 % — ABNORMAL HIGH (ref 17.9–39.5)
TIBC: 287 ug/dL (ref 250–450)
UIBC: 132 ug/dL

## 2024-03-10 LAB — VITAMIN B12: Vitamin B-12: >4000 pg/mL — ABNORMAL HIGH (ref 180–914)

## 2024-03-10 LAB — FOLATE: Folate: 11.5 ng/mL

## 2024-03-10 LAB — FERRITIN: Ferritin: 342 ng/mL — ABNORMAL HIGH (ref 24–336)

## 2024-03-10 MED ORDER — CYANOCOBALAMIN 1000 MCG/ML IJ SOLN
1000.0000 ug | Freq: Once | INTRAMUSCULAR | Status: AC
  Administered 2024-03-10: 1000 ug via INTRAMUSCULAR
  Filled 2024-03-10: qty 1

## 2024-03-10 NOTE — Progress Notes (Signed)
 CRITICAL VALUE ALERT Critical value received:  platelets 26 Date of notification:  03-10-24 Time of notification: 1141 Critical value read back:  Yes.   Nurse who received alert:  C Yanette Tripoli RN MD notified time and response:  Dr. Davonna 1210 no new orders.

## 2024-03-10 NOTE — Progress Notes (Signed)
 Patient tolerated injection with no complaints voiced. Site clean and dry with no bruising or swelling noted at site. See MAR for details. Band aid applied.  Patient stable during and after injection. VSS with discharge and left in satisfactory condition with no s/s of distress noted.

## 2024-03-10 NOTE — Patient Instructions (Signed)
 [facility] [place] - A DE[facility]  Discharge Instructions: Thank you for choosing [facility] to provide your oncology and hematology care.  If you have a lab appointment with the Cancer Center - please note that after April 8th, 2024, all labs will be drawn in the cancer center.  You do not have to check in or register with the main entrance as you have in the past but will complete your check-in in the cancer center.  Wear comfortable clothing and clothing appropriate for easy access to any Portacath or PICC line.   We strive to give you quality time with your provider. You may need to reschedule your appointment if you arrive late (15 or more minutes).  Arriving late affects you and other patients whose appointments are after yours.  Also, if you miss three or more appointments without notifying the office, you may be dismissed from the clinic at the provider's discretion.      For prescription refill requests, have your pharmacy contact our office and allow 72 hours for refills to be completed.    Today you received the following B12 injection, return as scheduled.   To help prevent nausea and vomiting after your treatment, we encourage you to take your nausea medication as directed.  BELOW ARE SYMPTOMS THAT SHOULD BE REPORTED IMMEDIATELY: *FEVER GREATER THAN 100.4 F (38 C) OR HIGHER *CHILLS OR SWEATING *NAUSEA AND VOMITING THAT IS NOT CONTROLLED WITH YOUR NAUSEA MEDICATION *UNUSUAL SHORTNESS OF BREATH *UNUSUAL BRUISING OR BLEEDING *URINARY PROBLEMS (pain or burning when urinating, or frequent urination) *BOWEL PROBLEMS (unusual diarrhea, constipation, pain near the anus) TENDERNESS IN MOUTH AND THROAT WITH OR WITHOUT PRESENCE OF ULCERS (sore throat, sores in mouth, or a toothache) UNUSUAL RASH, SWELLING OR PAIN  UNUSUAL VAGINAL DISCHARGE OR ITCHING   Items with * indicate a potential emergency and should be followed up as soon as  possible or go to the Emergency Department if any problems should occur.  Please show the CHEMOTHERAPY ALERT CARD or IMMUNOTHERAPY ALERT CARD at check-in to the Emergency Department and triage nurse.  Should you have questions after your visit or need to cancel or reschedule your appointment, please contact [facility] [place] - A DEPT OF [facility] [phone]  and follow the prompts.  Office hours are 8:00 a.m. to 4:30 p.m. Monday - Friday. Please note that voicemails left after 4:00 p.m. may not be returned until the following business day.  We are closed weekends and major holidays. You have access to a nurse at all times for urgent questions. Please call the main number to the clinic [phone] and follow the prompts.  For any non-urgent questions, you may also contact your provider using MyChart. We now offer e-Visits for anyone 69 and older to request care online for non-urgent symptoms. For details visit [email or web address].   Also download the MyChart app! Go to the app store, search "MyChart", open the app, select [place], and log in with your MyChart username and password.

## 2024-03-13 ENCOUNTER — Inpatient Hospital Stay

## 2024-03-13 ENCOUNTER — Inpatient Hospital Stay: Admitting: Oncology

## 2024-03-13 DIAGNOSIS — D693 Immune thrombocytopenic purpura: Secondary | ICD-10-CM | POA: Diagnosis not present

## 2024-03-13 DIAGNOSIS — E538 Deficiency of other specified B group vitamins: Secondary | ICD-10-CM | POA: Diagnosis not present

## 2024-03-13 NOTE — Progress Notes (Signed)
 " [facility] at [facility]  Hematology Follow-up Virtual Visit via Telephone Note  [name], MD  I connected with [name] on 03/13/24 at  1:20 PM EST by telephone and verified that I am speaking with the correct person using two identifiers.  Location: Patient: Home  Provider: Home office  REASON FOR FOLLOW-UP: Thrombocytopenia  ASSESSMENT & PLAN:  Patient is a 89 y.o. male following for thrombocytopenia  Assessment and Plan   Idiopathic thrombocytopenic thrombocytopenia (ITP) Chronic thrombocytopenia for several years based on lab review at least since 2009, usual range is 100-120 Likely secondary to recent amoxicillin use and vitamin B12 deficiency.  ITP on the differential LDH and haptoglobin normal.  Normal iron and copper  level Hepatitis B&C negative Peripheral smear with no schistocytes and occasional small platelet clumps and big platelets Ultrasound abdomen showed no evidence of hepatosplenomegaly Patient did a trial of 4 days of dexamethasone  with no significant improvement No increased bleeding or bruising Did not improve with vitamin B12 supplementation. Started on prednisone  taper on 11/19/2023 and completed on 12/09/2023.  S/p 1 dose of IVIG on 11/23/2023 with good response Good response to IVIG strengthens ITP diagnosis Patient is reluctant to do bone marrow biopsy   - Platelets 26 today.  Patient denies any petechia or purpura today. -Considering patient's platelets are less than 30, discussed starting rituximab weekly x 4 doses -If patient does not have good response to this, can consider doing oral TPO like promacta. -Risks Vs benefits of rituximab therapy discussed in detail. Also reviewed side effects. -Rituximab commonly causes infusion-related reactions such as fever, chills, rash, hypotension, and shortness of breath, especially during the first infusion. It increases the risk of infections due to B-cell  depletion and can cause blood abnormalities like neutropenia or thrombocytopenia. Rare but serious side effects include hepatitis B reactivation, progressive multifocal leukoencephalopathy (PML), and severe cardiac or pulmonary events. -Patient is in agreement to proceed with the treatment  - Continue parenteral and oral vitamin B12 supplementation -Will check for H.Pylori. Patient is not on PPI.  - Recommended patient to go to the ER with headache, petechia or purpura.   Return to clinic in 6 weeks with labs.   Vitamin B12 deficiency Ongoing treatment with B12 supplementation. Platelet count remains low, indicating B12 deficiency may not be the sole cause of thrombocytopenia. Will stop parenteral B12 supplementation   - Continue oral B12 supplementation.   No orders of the defined types were placed in this encounter.   I discussed the assessment and treatment plan with the patient. The patient was provided an opportunity to ask questions and all were answered. The patient agreed with the plan and demonstrated an understanding of the instructions.   The patient was advised to call back or seek an in-person evaluation if the symptoms worsen or if the condition fails to improve as anticipated.  I provided 30 minutes of non-face-to-face time during this encounter.   [name], MD 1/26/20262:14 PM    SUMMARY OF HEMATOLOGIC HISTORY: Chronic thrombocytopenia with a platelet range of 100-120 at least since 2009 07/2023: Recent drop to 17 08/12/2023:Labs consistent with severe vitamin B12 deficiency 08/13/2023:Ultrasound abdomen showed no hepatosplenomegaly 11/19/2023- 12/09/2023: Started on prednisone  1 mg/kg with taper of 20 mg/week 11/23/2023: S/p 1 dose of IVIG  INTERVAL HISTORY:  I discussed the limitations, risks, security and privacy concerns of performing an evaluation and management service by telephone and the availability of in person appointments. I also  discussed with  the patient that there may be a patient responsible charge related to this service. The patient expressed understanding and agreed to proceed.   [name] 89 y.o. male following for ITP. He reports no complaints today and has been doing well. Denies petechiae and purpura.   We discussed the lab results and the necessity to start treatment considering his platelets are less than 30. Patient is in agreement. Will schedule for weekly rituximab infusions.    I have reviewed the past medical history, past surgical history, social history and family history with the patient   ALLERGIES:  has no known allergies.  MEDICATIONS:  Current Outpatient Medications  Medication Sig Dispense Refill   allopurinol (ZYLOPRIM) 300 MG tablet Take 1 tablet by mouth daily. (Patient not taking: Reported on 02/09/2024)     amLODipine  (NORVASC ) 10 MG tablet TAKE 1 TABLET BY MOUTH EVERY DAY (Patient not taking: Reported on 02/09/2024) 90 tablet 3   aspirin 81 MG tablet Take 81 mg by mouth daily. (Patient not taking: Reported on 02/09/2024)     cholecalciferol (VITAMIN D) 1000 UNITS tablet Take 2,000 Units by mouth daily.     cyanocobalamin  (VITAMIN B12) 1000 MCG tablet Take 1 tablet (1,000 mcg total) by mouth daily. 90 tablet 2   isosorbide mononitrate (IMDUR) 60 MG 24 hr tablet Take 60 mg by mouth daily. (Patient not taking: Reported on 02/09/2024)     lisinopril  (ZESTRIL ) 10 MG tablet TAKE 1 TABLET BY MOUTH EVERY DAY (Patient not taking: Reported on 02/09/2024) 90 tablet 3   meloxicam (MOBIC) 7.5 MG tablet Take 1 tablet by mouth daily. (Patient not taking: Reported on 02/09/2024)     nitroGLYCERIN  (NITROSTAT ) 0.4 MG SL tablet Place 1 tablet (0.4 mg total) under the tongue every 5 (five) minutes as needed. 25 tablet 3   olmesartan (BENICAR) 5 MG tablet Take 5 mg by mouth daily.     potassium chloride SA (KLOR-CON M) 20 MEQ tablet Take 20 mEq by mouth daily. (Patient not taking: Reported on 02/09/2024)      pravastatin  (PRAVACHOL ) 40 MG tablet TAKE 1 TABLET BY MOUTH EVERY EVENING (Patient not taking: Reported on 02/09/2024) 90 tablet 1   No current facility-administered medications for this visit.    PHYSICAL EXAMINATION: Deferred for virtual visit  LABORATORY DATA:  I have reviewed the data as listed  Lab Results  Component Value Date   WBC 7.0 03/10/2024   NEUTROABS 3.9 03/10/2024   HGB 16.4 03/10/2024   HCT 49.5 03/10/2024   MCV 91.7 03/10/2024   PLT 26 (LL) 03/10/2024      Component Value Date/Time   NA 140 03/10/2024 1010   K 4.6 03/10/2024 1010   CL 101 03/10/2024 1010   CO2 24 03/10/2024 1010   GLUCOSE 93 03/10/2024 1010   BUN 13 03/10/2024 1010   CREATININE 0.92 03/10/2024 1010   CALCIUM 9.4 03/10/2024 1010   PROT 7.5 03/10/2024 1010   ALBUMIN 4.7 03/10/2024 1010   AST 22 03/10/2024 1010   ALT 14 03/10/2024 1010   ALKPHOS 83 03/10/2024 1010   BILITOT 0.7 03/10/2024 1010   GFRNONAA >60 03/10/2024 1010   GFRAA  11/22/2007 0325    >60        The eGFR has been calculated using the MDRD equation. This calculation has not been validated in all clinical     RADIOGRAPHIC STUDIES: I have personally reviewed the radiological images as listed and agreed with the findings in the  report. No results found.    "

## 2024-03-14 MED ORDER — LIDOCAINE-PRILOCAINE 2.5-2.5 % EX CREA: TOPICAL_CREAM | CUTANEOUS | 3 refills | Status: AC

## 2024-03-14 NOTE — Patient Instructions (Signed)
 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  You will receive the drug Rituxan.  You will be given this therapy weekly and then the oncologist will recheck labs and to see your response to the medication.  You will see the doctor regularly throughout treatment.  We will obtain blood work from you prior to every treatment and monitor your results to make sure it is safe to give your treatment. The doctor monitors your response to treatment by the way you are feeling, your blood work, and by obtaining scans periodically.  There will be wait times while you are here for treatment.  It will take about 30 minutes to 1 hour for your lab work to result.  Then there will be wait times while pharmacy mixes your medications.     Medications you will receive in the clinic prior to your chemotherapy medications:  Tylenol :  Given to prevent infusion reactions to rituximab.  Benadryl :  Antihistamine given to prevent allergic/infusion reactions to rituximab.   Pepcid :  This medication is a histamine blocker that helps prevent and allergic reaction to your chemotherapy.     Rituximab (Generic Name) Other Name: Rituxan  About This Drug  Rituximab is a monoclonal antibody used to treat cancer. This drug is given in the vein (IV).  This first time this is given it will be infused slower to monitor for infusion reactions.    Possible Side Effects (More Common)   Bone marrow depression. This is a decrease in the number of white blood cells, red blood cells, and platelets. This  may raise your risk of infection, make you tired and weak (fatigue), and raise your risk of bleeding.   Rash-skin irritation, redness or itching (dermatitis)   Flu-like symptoms: fever, headache, muscle and joint aches, and fatigue  (low energy, feeling weak)   Infusion-related reactions   Hepatitis B - if you have ever had hepatitis B, the virus may come back during treatment with this drug. Your doctor will test to see if you have ever had hepatitis B prior to your treatment.   Changes in your central nervous system can happen. The central nervous system is made up of your brain and spinal cord. You could feel: extreme tiredness, agitation, confusion, or have: hallucinations (see or hear things that are not there), trouble understanding or speaking, loss of control of your bowels or bladder, eyesight changes, numbness or lack of strength to your arms, legs, face, or body, seizures or coma. If you start to have any of these symptoms let your doctor know right away.   Tumor lysis: This drug may act on the cancer cells very quickly. This may affect how your kidneys work. Your doctor will monitor your kidney function.   Changes in your liver function. Your doctor will check your liver function as needed.   Nausea and throwing up (vomiting): these symptoms may happen within a few hours after your treatment and may last up to 24 hours. Medicines are available to stop or lessen these side effects.   Loose bowel movements (diarrhea) that may last for a few days   Abdominal pain   Infections   Cough, runny nose   Swelling of your legs, ankles and/or feet or hands   High blood pressure. Your doctor will check your blood pressure as  needed.   Abnormal heart beat  Possible Side Effects (Less Common)   Shortness of breath   Soreness of the mouth and throat. You may have red areas, white patches, or sores that hurt.  Infusion Reactions  Infusion Reactions are the most common side effect linked to use of this drug and can be quite severe. Medicines will be given before you get the drug to lower the severity of this side effect. The infusion reactions are the worse with the first dose of the drug and become less severe  with more doses of the drug. While you are getting this drug in your vein (IV), tell your nurse right away if you have any of these symptoms of an allergic reaction:   Trouble catching your breath   Feeling like your tongue or throat are swelling   Feeling your heart beat quickly or in a not normal way (palpitations)   Feeling dizzy or lightheaded   Flushing, itching, rash, and/or hives   Treating Side Effects   Ask your doctor or nurse about medicine to stop or lessen headache, loose bowel movements (diarrhea), constipation, nausea, throwing up (vomiting), or pain.   If you get a rash do not put anything it unless your doctor or nurse says you may. Keep the area around the rash clean and dry. Ask your doctor for medicine if the rash bothers you.   Drink 6-8 cups of fluids each day unless your doctor has told you to limit your fluid intake due to some other health problem. A cup is 8 ounces of fluid. If you throw up or have loose bowel movements, you should drink more fluids so that you do not become dehydrated (lack of water in the body from losing too much fluid).   If you are not able to move your bowels, check with your doctor or nurse before you use enemas, laxatives, or suppositories   If you have mouth sores, avoid mouthwash that has alcohol. Also avoid alcohol and smoking because they can bother your mouth and throat.   If you have a nose bleed, sit with your head tipped slightly forward. Apply pressure by lightly pinching the bridge of your nose between your thumb and forefinger. Call your doctor if you feel dizzy or faint or if the bleeding doesn't stop after 10 to 15 minutes   Important Information   After treatment with this drug, vaccination with live viruses should be delayed until the immune system recovers.   Symptoms of abnormal bleeding may be: coughing up blood, throwing up blood (may look like coffee grounds), red or black, tarry bowel movements, blood in urine,  abnormally heavy menstrual flow, nosebleeds, or any unusual bleeding.   Symptoms of high blood pressure may be: headache, blurred vision, confusion, chest pain, or a feeling that your heart is beat differently.   Urinary tract infection. Symptoms may include:   Pain or burning when you pass urine   Feeling like you have to pass urine often, but not much comes out when you do.   Tender or heavy feeling in your lower abdomen   Cloudy urine and/or urine that smells bad.   Pain on one side of your back under your ribs. This is where your kidneys are.   Fever, chills, nausea and/or throwing up   Food and Drug Interactions  There are no known interactions of rituximab and any food. This drug may interact with other medicines. Tell your doctor and pharmacist about all the medicines  and dietary supplements (vitamins, minerals, herbs and others) that you are taking at this time. The safety and use of dietary supplements and alternative diets are often not known. Using these might affect your cancer or interfere with your treatment. Until more is known, you should not use dietary supplements or alternative diets without your cancer doctor's help.   When to Call the Doctor  Call your doctor or nurse right away if you have any of these symptoms:   Fever of 100.4 F (38 C) higher   Chills   Trouble breathing   Rash with or without itching   Blistering or peeling of skin   Chest pain or symptoms of a heart attack. Most heart attacks involve pain in the center of the chest that lasts more than a few minutes. The pain may go away and come back or it can be constant. It can feel like pressure, squeezing, fullness, or pain. Sometimes pain is felt in one or both arms, the back, neck, jaw, or stomach. If any of these symptoms last 2 minutes, call 911   Easy bleeding or bruising   Blood in urine or bowel movements   Feeling that your heart is beating in a fast or not normal way  (palpitations)   Nausea that stops you from eating or drinking   Throwing up/vomiting   Abdominal pain   Loose bowel movements (diarrhea) 4 times in one day or diarrhea with weakness or lightheadedness   No bowel movement in 3 days or if you feel uncomfortable   Feeling dizzy or lightheaded   Changes in your speech or vision   Feeling confused   Weakness of your arms and legs or poor coordination (feeling clumsy)   Signs of liver problems: dark urine, pale bowel movements, bad stomach pain, feeling very tired and weak, unusual itching, or yellowing of skin or eyes   Symptoms of a urinary tract infection (see important information)   Call your doctor or nurse as soon as possible if you have any of these symptoms:   Swelling of your legs, ankles and/or feet   Fatigue and /or weakness that interferes with your daily activities   Joint and muscle pain or muscle spasms that are not relieved by prescribed medicines   Cough that lasts longer than normal   Reproduction Concerns   Pregnancy warning: This drug is known to cross the placenta. This drug may have harmful effects on an unborn baby. Effective methods of birth control should be used during treatment with this drug and for 12 months after the last treatment. If exposure occurs to an unborn baby, the baby's immune system may be affected, which could last for months after birth. Until the immune system recovers, live vaccines should not be administered to the baby. Be sure to talk with your doctor if you are pregnant or planning to become pregnant while getting this drug.   Breast feeding warning: It is not known if rituximab is passed into human breast milk. In animal studies, this drug was detected in in breast milk. For this reason, women should talk to their doctor about the risks and benefits of breast feeding during treatment with this drug because this drug may enter the breast milk and badly harm a breast feeding baby.      SELF CARE ACTIVITIES WHILE ON CHEMOTHERAPY/IMMUNOTHERAPY:  Hydration Increase your fluid intake 48 hours prior to treatment and drink at least 8 to 12 cups (64 ounces) of water/decaffeinated beverages per day  after treatment. You can still have your cup of coffee or soda but these beverages do not count as part of your 8 to 12 cups that you need to drink daily. No alcohol intake.  Medications Continue taking your normal prescription medication as prescribed.  If you start any new herbal or new supplements please let us  know first to make sure it is safe.  Mouth Care Have teeth cleaned professionally before starting treatment. Keep dentures and partial plates clean. Use soft toothbrush and do not use mouthwashes that contain alcohol. Biotene is a good mouthwash that is available at most pharmacies or may be ordered by calling [phone]. Use warm salt water gargles (1 teaspoon salt per 1 quart warm water) before and after meals and at bedtime. Or you may rinse with 2 tablespoons of three-percent hydrogen peroxide mixed in eight ounces of water. If you are still having problems with your mouth or sores in your mouth please call the clinic. If you need dental work, please let the doctor know before you go for your appointment so that we can coordinate the best possible time for you in regards to your chemo regimen. You need to also let your dentist know that you are actively taking chemo. We may need to do labs prior to your dental appointment.  Skin Care Always use sunscreen that has not expired and with SPF (Sun Protection Factor) of 50 or higher. Wear hats to protect your head from the sun. Remember to use sunscreen on your hands, ears, face, & feet.  Use good moisturizing lotions such as udder cream, eucerin, or even Vaseline. Some chemotherapies can cause dry skin, color changes in your skin and nails.    Avoid long, hot showers or baths. Use gentle, fragrance-free soaps and laundry  detergent. Use moisturizers, preferably creams or ointments rather than lotions because the thicker consistency is better at preventing skin dehydration. Apply the cream or ointment within 15 minutes of showering. Reapply moisturizer at night, and moisturize your hands every time after you wash them.   Infection Prevention Please wash your hands for at least 30 seconds using warm soapy water. Handwashing is the #1 way to prevent the spread of germs. Stay away from sick people or people who are getting over a cold. If you develop respiratory systems such as green/yellow mucus production or productive cough or persistent cough let us  know and we will see if you need an antibiotic. It is a good idea to keep a pair of gloves on when going into grocery stores/[employer] to decrease your risk of coming into contact with germs on the carts, etc. Carry alcohol hand gel with you at all times and use it frequently if out in public. If your temperature reaches 100.5 or higher please call the clinic and let us  know.  If it is after hours or on the weekend please go to the ER if your temperature is over 100.4.  Please have your own personal thermometer at home to use.    Sex and bodily fluids If you are going to have sex, a condom must be used to protect the person that isn't taking immunotherapy. For a few days after treatment, immunotherapy can be excreted through your bodily fluids.  When using the toilet please close the lid and flush the toilet twice.  Do this for a few day after you have had immunotherapy.   Contraception It is not known for sure whether or not immunotherapy drugs can be passed on  through semen or secretions from the vagina. Because of this some doctors advise people to use a barrier method if you have sex during treatment. This applies to vaginal, anal or oral sex.  Generally, doctors advise a barrier method only for the time you are actually having the treatment and for about a week after your  treatment.  Advice like this can be worrying, but this does not mean that you have to avoid being intimate with your partner. You can still have close contact with your partner and continue to enjoy sex.  Animals If you have cats or birds we just ask that you not change the litter or change the cage.  Please have someone else do this for you while you are on immunotherapy.   Food Safety During and After Cancer Treatment Food safety is important for people both during and after cancer treatment. Cancer and cancer treatments, such as chemotherapy, radiation therapy, and stem cell/bone marrow transplantation, often weaken the immune system. This makes it harder for your body to protect itself from foodborne illness, also called food poisoning. Foodborne illness is caused by eating food that contains harmful bacteria, parasites, or viruses.  Foods to avoid Some foods have a higher risk of becoming tainted with bacteria. These include: Unwashed fresh fruit and vegetables, especially leafy vegetables that can hide dirt and other contaminants Raw sprouts, such as alfalfa sprouts Raw or undercooked beef, especially ground beef, or other raw or undercooked meat and poultry Fatty, fried, or spicy foods immediately before or after treatment.  These can sit heavy on your stomach and make you feel nauseous. Raw or undercooked shellfish, such as oysters. Sushi and sashimi, which often contain raw fish.  Unpasteurized beverages, such as unpasteurized fruit juices, raw milk, raw yogurt, or cider Undercooked eggs, such as soft boiled, over easy, and poached; raw, unpasteurized eggs; or foods made with raw egg, such as homemade raw cookie dough and homemade mayonnaise  Simple steps for food safety  Shop smart. Do not buy food stored or displayed in an unclean area. Do not buy bruised or damaged fruits or vegetables. Do not buy cans that have cracks, dents, or bulges. Pick up foods that can spoil at the end  of your shopping trip and store them in a cooler on the way home.  Prepare and clean up foods carefully. Rinse all fresh fruits and vegetables under running water, and dry them with a clean towel or paper towel. Clean the top of cans before opening them. After preparing food, wash your hands for 20 seconds with hot water and soap. Pay special attention to areas between fingers and under nails. Clean your utensils and dishes with hot water and soap. Disinfect your kitchen and cutting boards using 1 teaspoon of liquid, unscented bleach mixed into 1 quart of water.    Dispose of old food. Eat canned and packaged food before its expiration date (the use by or best before date). Consume refrigerated leftovers within 3 to 4 days. After that time, throw out the food. Even if the food does not smell or look spoiled, it still may be unsafe. Some bacteria, such as Listeria, can grow even on foods stored in the refrigerator if they are kept for too long.  Take precautions when eating out. At restaurants, avoid buffets and salad bars where food sits out for a long time and comes in contact with many people. Food can become contaminated when someone with a virus, often a norovirus, or another  bug handles it. Put any leftover food in a to-go container yourself, rather than having the server do it. And, refrigerate leftovers as soon as you get home. Choose restaurants that are clean and that are willing to prepare your food as you order it cooked.    SYMPTOMS TO REPORT AS SOON AS POSSIBLE AFTER TREATMENT:  FEVER GREATER THAN 100.4 F CHILLS WITH OR WITHOUT FEVER NAUSEA AND VOMITING THAT IS NOT CONTROLLED WITH YOUR NAUSEA MEDICATION UNUSUAL SHORTNESS OF BREATH UNUSUAL BRUISING OR BLEEDING TENDERNESS IN MOUTH AND THROAT WITH OR WITHOUT PRESENCE OF ULCERS URINARY PROBLEMS BOWEL PROBLEMS UNUSUAL RASH     Wear comfortable clothing and clothing appropriate for easy access to any Portacath or PICC  line. Let us  know if there is anything that we can do to make your therapy better!   What to do if you need assistance after hours or on the weekends: CALL [phone].  HOLD on the line, do not hang up.  You will hear multiple messages but at the end you will be connected with a nurse triage line.  They will contact the doctor if necessary.  Most of the time they will be able to assist you.  Do not call the hospital operator.    I have been informed and understand all of the instructions given to me and have received a copy. I have been instructed to call the clinic [phone] or my family physician as soon as possible for continued medical care, if indicated. I do not have any more questions at this time but understand that I may call the Cancer Center or the Patient Navigator at [phone] during office hours should I have questions or need assistance in obtaining follow-up care.

## 2024-03-15 ENCOUNTER — Inpatient Hospital Stay: Admitting: Licensed Clinical Social Worker

## 2024-03-15 DIAGNOSIS — D693 Immune thrombocytopenic purpura: Secondary | ICD-10-CM

## 2024-03-15 NOTE — Progress Notes (Signed)
 [facility] Clinical Social Work  Initial Assessment   [name] is a 89 y.o. year old male contacted by phone. Clinical Social Work was referred by medical provider for assessment of psychosocial needs.   SDOH (Social Determinants of Health) assessments performed: Yes SDOH Interventions    Flowsheet Row Office Visit from 08/12/2023 in [facility] [place] - [facility] [place]  SDOH Interventions   Food Insecurity Interventions Intervention Not Indicated  Housing Interventions Intervention Not Indicated  Transportation Interventions Intervention Not Indicated  Utilities Interventions Intervention Not Indicated  Alcohol Usage Interventions Intervention Not Indicated (Score <7)  Financial Strain Interventions Intervention Not Indicated  Physical Activity Interventions Intervention Not Indicated  Stress Interventions Intervention Not Indicated  Social Connections Interventions Intervention Not Indicated  Health Literacy Interventions Intervention Not Indicated    SDOH Screenings   Food Insecurity: No Food Insecurity (08/12/2023)  Housing: Unknown (08/12/2023)  Transportation Needs: No Transportation Needs (08/12/2023)  Utilities: Not At Risk (08/12/2023)  Alcohol Screen: Low Risk (08/12/2023)  Depression (PHQ2-9): Low Risk (01/12/2024)  Financial Resource Strain: Low Risk (08/12/2023)  Physical Activity: Inactive (08/12/2023)  Social Connections: Moderately Integrated (08/12/2023)  Stress: No Stress Concern Present (08/12/2023)  Tobacco Use: Medium Risk (03/10/2024)  Health Literacy: Adequate Health Literacy (08/12/2023)    PHQ 2/9:    01/12/2024   10:06 AM 12/13/2023    8:32 AM 11/29/2023    8:27 AM  Depression screen PHQ 2/9  Decreased Interest 0 0 0  Down, Depressed, Hopeless 0 0 0  PHQ - 2 Score 0 0 0     Distress Screen completed: No     No data to display            Family/Social Information:  Housing Arrangement: patient lives  with his wife.  Pt reports both he and his wife are independent in ADLs. Family members/support persons in your life? Pt's daughter and granddaughter reside next door and are available to assist if needed. Transportation concerns: no  Employment: Retired .  Income source: Actor concerns: No Type of concern: None Food access concerns: no Religious or spiritual practice: Yes-  Advanced directives: Not known Services Currently in place:  none  Coping/ Adjustment to diagnosis: Patient understands treatment plan and what happens next? yes Concerns about diagnosis and/or treatment: Quality of life Patient reported stressors: Adjusting to my illness Hopes and/or priorities: pt's priority is to start treatment w/ the hope of positive results. Patient enjoys time with family/ friends Current coping skills/ strengths: Capable of independent living , Contractor , Motivation for treatment/growth , Physical Health , Religious Affiliation , and Supportive family/friends     SUMMARY: Current SDOH Barriers:  No barriers identified at this time.  Clinical Social Work Clinical Goal(s):  No clinical social work goals at this time  Interventions: Discussed common feeling and emotions when being diagnosed with cancer, and the importance of support during treatment Informed patient of the support team roles and support services at [facility] Provided CSW contact information and encouraged patient to call with any questions or concerns    Follow Up Plan: Patient will contact CSW with any support or resource needs Patient verbalizes understanding of plan: Yes    Frank JONELLE [name], LCSW Clinical Social Worker [facility]

## 2024-03-16 ENCOUNTER — Inpatient Hospital Stay

## 2024-03-16 DIAGNOSIS — D693 Immune thrombocytopenic purpura: Secondary | ICD-10-CM

## 2024-03-16 NOTE — Progress Notes (Signed)

## 2024-03-17 ENCOUNTER — Inpatient Hospital Stay

## 2024-03-17 DIAGNOSIS — D693 Immune thrombocytopenic purpura: Secondary | ICD-10-CM

## 2024-03-17 LAB — HEPATITIS B SURFACE ANTIGEN: Hepatitis B Surface Ag: NONREACTIVE

## 2024-03-18 LAB — HEPATITIS B CORE ANTIBODY, TOTAL: HEP B CORE AB: NEGATIVE

## 2024-03-19 ENCOUNTER — Encounter: Payer: Self-pay | Admitting: *Deleted

## 2024-03-20 ENCOUNTER — Other Ambulatory Visit: Payer: Self-pay | Admitting: Oncology

## 2024-03-20 ENCOUNTER — Inpatient Hospital Stay

## 2024-03-23 ENCOUNTER — Inpatient Hospital Stay

## 2024-03-23 VITALS — BP 161/72 | HR 85 | Temp 97.2°F | Resp 18 | Wt 166.0 lb

## 2024-03-23 DIAGNOSIS — D693 Immune thrombocytopenic purpura: Secondary | ICD-10-CM

## 2024-03-23 MED ORDER — SODIUM CHLORIDE 0.9 % IV SOLN
375.0000 mg/m2 | Freq: Once | INTRAVENOUS | Status: AC
  Administered 2024-03-23: 700 mg via INTRAVENOUS
  Filled 2024-03-23: qty 50

## 2024-03-23 MED ORDER — SODIUM CHLORIDE 0.9 % IV SOLN: INTRAVENOUS | Status: DC

## 2024-03-23 MED ORDER — ACETAMINOPHEN 325 MG PO TABS
650.0000 mg | ORAL_TABLET | Freq: Once | ORAL | Status: AC
  Administered 2024-03-23: 650 mg via ORAL
  Filled 2024-03-23: qty 2

## 2024-03-23 MED ORDER — DIPHENHYDRAMINE HCL 25 MG PO CAPS: 50.0000 mg | ORAL_CAPSULE | Freq: Once | ORAL | Status: DC

## 2024-03-23 MED ORDER — FAMOTIDINE IN NACL 20-0.9 MG/50ML-% IV SOLN
20.0000 mg | Freq: Once | INTRAVENOUS | Status: AC
  Administered 2024-03-23: 20 mg via INTRAVENOUS
  Filled 2024-03-23: qty 50

## 2024-03-23 MED ORDER — DIPHENHYDRAMINE HCL 25 MG PO TABS
50.0000 mg | ORAL_TABLET | Freq: Once | ORAL | Status: AC
  Administered 2024-03-23: 50 mg via ORAL
  Filled 2024-03-23: qty 2

## 2024-03-23 NOTE — Progress Notes (Signed)
 Pharmacist Chemotherapy Monitoring - Initial Assessment    Anticipated start date: 03/23/2024   The following has been reviewed per standard work regarding the patient's treatment regimen: The patient's diagnosis, treatment plan and drug doses, and organ/hematologic function Lab orders and baseline tests specific to treatment regimen  The treatment plan start date, drug sequencing, and pre-medications Prior authorization status  Patient's documented medication list, including drug-drug interaction screen and prescriptions for anti-emetics and supportive care specific to the treatment regimen The drug concentrations, fluid compatibility, administration routes, and timing of the medications to be used The patient's access for treatment and lifetime cumulative dose history, if applicable  The patient's medication allergies and previous infusion related reactions, if applicable   Changes made to treatment plan:  N/A  Follow up needed:  N/A   [name], [facility], 03/23/2024  9:24 AM

## 2024-03-23 NOTE — Progress Notes (Signed)
 Patient presents today for C1D1 Ruxience  for ITP. Consent obtained and documented. Vital signs within parameters for treatment. Hepatitis labs drawn on 03-17-2024.   Treatment given today per MD orders. Tolerated infusion without adverse affects. Vital signs stable. No complaints at this time. Discharged from clinic ambulatory in stable condition. Alert and oriented x 3. F/U with [facility] as scheduled.

## 2024-03-23 NOTE — Patient Instructions (Signed)
 [facility] - A DEPT OF [facility]  Discharge Instructions: Thank you for choosing [facility] to provide your oncology and hematology care.  If you have a lab appointment with the Cancer Center - please note that after April 8th, 2024, all labs will be drawn in the cancer center.  You do not have to check in or register with the main entrance as you have in the past but will complete your check-in in the cancer center.  Wear comfortable clothing and clothing appropriate for easy access to any Portacath or PICC line.   We strive to give you quality time with your provider. You may need to reschedule your appointment if you arrive late (15 or more minutes).  Arriving late affects you and other patients whose appointments are after yours.  Also, if you miss three or more appointments without notifying the office, you may be dismissed from the clinic at the providers discretion.      For prescription refill requests, have your pharmacy contact our office and allow 72 hours for refills to be completed.    Today you received the following chemotherapy and/or immunotherapy agents Ruxience . Rituximab  Injection What is this medication? RITUXIMAB  (ri TUX i mab) treats leukemia and lymphoma. It works by blocking a protein that causes cancer cells to grow and multiply. This helps to slow or stop the spread of cancer cells. It may also be used to treat autoimmune conditions, such as arthritis. It works by slowing down an overactive immune system. It is a monoclonal antibody. This medicine may be used for other purposes; ask your health care provider or pharmacist if you have questions. COMMON BRAND NAME(S): RIABNI , Rituxan , RUXIENCE , truxima  What should I tell my care team before I take this medication? They need to know if you have any of these conditions: Chest pain Heart disease Immune system problems Infection, such as chickenpox, cold sores, hepatitis B,  herpes Irregular heartbeat or rhythm Kidney disease Low blood counts, such as low white cells, platelets, red cells Lung disease Recent or upcoming vaccine An unusual or allergic reaction to rituximab , other medications, foods, dyes, or preservatives Pregnant or trying to get pregnant Breast-feeding How should I use this medication? This medication is injected into a vein. It is given by a care team in a hospital or clinic setting. A special MedGuide will be given to you before each treatment. Be sure to read this information carefully each time. Talk to your care team about the use of this medication in children. While this medication may be prescribed for children as young as 6 months for selected conditions, precautions do apply. Overdosage: If you think you have taken too much of this medicine contact a poison control center or emergency room at once. NOTE: This medicine is only for you. Do not share this medicine with others. What if I miss a dose? Keep appointments for follow-up doses. It is important not to miss your dose. Call your care team if you are unable to keep an appointment. What may interact with this medication? Do not take this medication with any of the following: Live vaccines This medication may also interact with the following: Cisplatin This list may not describe all possible interactions. Give your health care provider a list of all the medicines, herbs, non-prescription drugs, or dietary supplements you use. Also tell them if you smoke, drink alcohol, or use illegal drugs. Some items may interact with your medicine. What  should I watch for while using this medication? Your condition will be monitored carefully while you are receiving this medication. You may need blood work while taking this medication. This medication can cause serious infusion reactions. To reduce the risk your care team may give you other medications to take before receiving this one. Be sure to  follow the directions from your care team. This medication may increase your risk of getting an infection. Call your care team for advice if you get a fever, chills, sore throat, or other symptoms of a cold or flu. Do not treat yourself. Try to avoid being around people who are sick. Call your care team if you are around anyone with measles, chickenpox, or if you develop sores or blisters that do not heal properly. Avoid taking medications that contain aspirin , acetaminophen , ibuprofen , naproxen, or ketoprofen unless instructed by your care team. These medications may hide a fever. This medication may cause serious skin reactions. They can happen weeks to months after starting the medication. Contact your care team right away if you notice fevers or flu-like symptoms with a rash. The rash may be red or purple and then turn into blisters or peeling of the skin. You may also notice a red rash with swelling of the face, lips, or lymph nodes in your neck or under your arms. In some patients, this medication may cause a serious brain infection that may cause death. If you have any problems seeing, thinking, speaking, walking, or standing, tell your care team right away. If you cannot reach your care team, urgently seek another source of medical care. Talk to your care team if you may be pregnant. Serious birth defects can occur if you take this medication during pregnancy and for 12 months after the last dose. You will need a negative pregnancy test before starting this medication. Contraception is recommended while taking this medication and for 12 months after the last dose. Your care team can help you find the option that works for you. Do not breastfeed while taking this medication and for at least 6 months after the last dose. What side effects may I notice from receiving this medication? Side effects that you should report to your care team as soon as possible: Allergic reactions or angioedema--skin rash,  itching or hives, swelling of the face, eyes, lips, tongue, arms, or legs, trouble swallowing or breathing Bowel blockage--stomach cramping, unable to have a bowel movement or pass gas, loss of appetite, vomiting Dizziness, loss of balance or coordination, confusion or trouble speaking Heart attack--pain or tightness in the chest, shoulders, arms, or jaw, nausea, shortness of breath, cold or clammy skin, feeling faint or lightheaded Heart rhythm changes--fast or irregular heartbeat, dizziness, feeling faint or lightheaded, chest pain, trouble breathing Infection--fever, chills, cough, sore throat, wounds that don't heal, pain or trouble when passing urine, general feeling of discomfort or being unwell Infusion reactions--chest pain, shortness of breath or trouble breathing, feeling faint or lightheaded Kidney injury--decrease in the amount of urine, swelling of the ankles, hands, or feet Liver injury--right upper belly pain, loss of appetite, nausea, light-colored stool, dark yellow or brown urine, yellowing skin or eyes, unusual weakness or fatigue Redness, blistering, peeling, or loosening of the skin, including inside the mouth Stomach pain that is severe, does not go away, or gets worse Tumor lysis syndrome (TLS)--nausea, vomiting, diarrhea, decrease in the amount of urine, dark urine, unusual weakness or fatigue, confusion, muscle pain or cramps, fast or irregular heartbeat, joint pain Side  effects that usually do not require medical attention (report to your care team if they continue or are bothersome): Headache Joint pain Nausea Runny or stuffy nose Unusual weakness or fatigue This list may not describe all possible side effects. Call your doctor for medical advice about side effects. You may report side effects to FDA at 1-800-FDA-1088. Where should I keep my medication? This medication is given in a hospital or clinic. It will not be stored at home. NOTE: This sheet is a summary. It  may not cover all possible information. If you have questions about this medicine, talk to your doctor, pharmacist, or health care provider.  2024 Elsevier/Gold Standard (2021-06-26 00:00:00)      To help prevent nausea and vomiting after your treatment, we encourage you to take your nausea medication as directed.  BELOW ARE SYMPTOMS THAT SHOULD BE REPORTED IMMEDIATELY: *FEVER GREATER THAN 100.4 F (38 C) OR HIGHER *CHILLS OR SWEATING *NAUSEA AND VOMITING THAT IS NOT CONTROLLED WITH YOUR NAUSEA MEDICATION *UNUSUAL SHORTNESS OF BREATH *UNUSUAL BRUISING OR BLEEDING *URINARY PROBLEMS (pain or burning when urinating, or frequent urination) *BOWEL PROBLEMS (unusual diarrhea, constipation, pain near the anus) TENDERNESS IN MOUTH AND THROAT WITH OR WITHOUT PRESENCE OF ULCERS (sore throat, sores in mouth, or a toothache) UNUSUAL RASH, SWELLING OR PAIN  UNUSUAL VAGINAL DISCHARGE OR ITCHING   Items with * indicate a potential emergency and should be followed up as soon as possible or go to the Emergency Department if any problems should occur.  Please show the CHEMOTHERAPY ALERT CARD or IMMUNOTHERAPY ALERT CARD at check-in to the Emergency Department and triage nurse.  Should you have questions after your visit or need to cancel or reschedule your appointment, please contact [facility] [place] - A DEPT OF [facility] [phone]  and follow the prompts.  Office hours are 8:00 a.m. to 4:30 p.m. Monday - Friday. Please note that voicemails left after 4:00 p.m. may not be returned until the following business day.  We are closed weekends and major holidays. You have access to a nurse at all times for urgent questions. Please call the main number to the clinic [phone] and follow the prompts.  For any non-urgent questions, you may also contact your provider using MyChart. We now offer e-Visits for anyone 49 and older to request care online for non-urgent symptoms. For details  visit [email or web address].   Also download the MyChart app! Go to the app store, search MyChart, open the app, select [facility], and log in with your MyChart username and password.

## 2024-03-24 ENCOUNTER — Telehealth: Payer: Self-pay

## 2024-03-24 NOTE — Telephone Encounter (Signed)
Patient called for 24-hour call back, no response from patient.

## 2024-03-27 ENCOUNTER — Inpatient Hospital Stay

## 2024-03-27 ENCOUNTER — Inpatient Hospital Stay: Admitting: Oncology

## 2024-03-30 ENCOUNTER — Inpatient Hospital Stay

## 2024-04-03 ENCOUNTER — Inpatient Hospital Stay

## 2024-04-06 ENCOUNTER — Inpatient Hospital Stay

## 2024-04-06 ENCOUNTER — Inpatient Hospital Stay: Admitting: Oncology

## 2024-04-10 ENCOUNTER — Inpatient Hospital Stay

## 2024-04-10 ENCOUNTER — Inpatient Hospital Stay: Admitting: Oncology

## 2024-04-13 ENCOUNTER — Inpatient Hospital Stay

## 2024-05-11 ENCOUNTER — Inpatient Hospital Stay

## 2024-06-12 ENCOUNTER — Inpatient Hospital Stay

## 2024-10-11 ENCOUNTER — Inpatient Hospital Stay

## 2024-11-10 ENCOUNTER — Inpatient Hospital Stay
